# Patient Record
Sex: Female | Born: 1975 | Race: White | Hispanic: No | Smoking: Never smoker
Health system: Southern US, Community
[De-identification: ages and names within clinical notes are randomized; demographics above are authoritative.]

## PROBLEM LIST (undated history)

## (undated) DIAGNOSIS — K121 Other forms of stomatitis: Secondary | ICD-10-CM

## (undated) DIAGNOSIS — Z9889 Other specified postprocedural states: Secondary | ICD-10-CM

## (undated) DIAGNOSIS — F5104 Psychophysiologic insomnia: Secondary | ICD-10-CM

## (undated) DIAGNOSIS — G43909 Migraine, unspecified, not intractable, without status migrainosus: Secondary | ICD-10-CM

## (undated) DIAGNOSIS — M62838 Other muscle spasm: Secondary | ICD-10-CM

## (undated) DIAGNOSIS — F419 Anxiety disorder, unspecified: Secondary | ICD-10-CM

## (undated) DIAGNOSIS — T8859XA Other complications of anesthesia, initial encounter: Secondary | ICD-10-CM

## (undated) DIAGNOSIS — K219 Gastro-esophageal reflux disease without esophagitis: Secondary | ICD-10-CM

## (undated) DIAGNOSIS — T884XXA Failed or difficult intubation, initial encounter: Secondary | ICD-10-CM

## (undated) DIAGNOSIS — R569 Unspecified convulsions: Secondary | ICD-10-CM

## (undated) DIAGNOSIS — D649 Anemia, unspecified: Secondary | ICD-10-CM

## (undated) HISTORY — DX: Other specified postprocedural states: Z98.890

## (undated) HISTORY — PX: OTHER SURGICAL HISTORY: SHX169

## (undated) HISTORY — DX: Psychophysiologic insomnia: F51.04

## (undated) HISTORY — DX: Other forms of stomatitis: K12.1

## (undated) HISTORY — DX: Anxiety disorder, unspecified: F41.9

## (undated) HISTORY — PX: ESOPHAGUS SURGERY: SHX626

## (undated) HISTORY — PX: CHOLECYSTECTOMY: SHX55

## (undated) HISTORY — DX: Other muscle spasm: M62.838

---

## 2011-09-07 HISTORY — PX: ESOPHAGOGASTRODUODENOSCOPY: SHX1529

## 2011-09-07 HISTORY — PX: OTHER SURGICAL HISTORY: SHX169

## 2011-09-07 HISTORY — PX: COLONOSCOPY: SHX174

## 2012-09-06 HISTORY — PX: BREAST EXCISIONAL BIOPSY: SUR124

## 2019-04-16 DIAGNOSIS — D696 Thrombocytopenia, unspecified: Secondary | ICD-10-CM | POA: Insufficient documentation

## 2019-04-16 HISTORY — DX: Thrombocytopenia, unspecified: D69.6

## 2019-07-05 ENCOUNTER — Ambulatory Visit
Admission: EM | Admit: 2019-07-05 | Discharge: 2019-07-05 | Disposition: A | Discharge status: Home / Self Care | Payer: Medicaid Other

## 2019-07-05 ENCOUNTER — Other Ambulatory Visit: Payer: Self-pay

## 2019-07-05 NOTE — ED Notes (Signed)
This RN went out to call patient from lobby.  Patient's visitor stood up and patient asked if he could come back.  This RN explained our current policy on no visitors, and patient's visitor states she is unable to be seen alone.  This RN asked why, and he states she has difficulty remembering her discharge instructions.  This RN explained that she or the provider could come out, with her permission, at the end of her visit, to discuss her discharge instructions, and he became aggressive and told the patient to get her three dollars back for her visit and they will find somewhere else to go.  This RN asked patient's visitor to please put a mask on as he is projecting his voice in a closed lobby, and that this RN states she can talk to the provider, and went to talk with provider.  Provider went to lobby with this RN to discuss situation with both, and patient's visitor became aggressive again, and states they want to leave.  Patient and visitor left.  Will d/c.

## 2019-08-25 ENCOUNTER — Other Ambulatory Visit: Payer: Self-pay

## 2019-08-25 ENCOUNTER — Encounter: Payer: Self-pay | Admitting: Emergency Medicine

## 2019-08-25 ENCOUNTER — Ambulatory Visit
Admission: EM | Admit: 2019-08-25 | Discharge: 2019-08-25 | Disposition: A | Discharge status: Home / Self Care | Payer: Medicaid Other | Attending: Family Medicine | Admitting: Family Medicine

## 2019-08-25 DIAGNOSIS — B9689 Other specified bacterial agents as the cause of diseases classified elsewhere: Secondary | ICD-10-CM | POA: Diagnosis present

## 2019-08-25 DIAGNOSIS — N760 Acute vaginitis: Secondary | ICD-10-CM | POA: Insufficient documentation

## 2019-08-25 DIAGNOSIS — R300 Dysuria: Secondary | ICD-10-CM | POA: Diagnosis present

## 2019-08-25 HISTORY — DX: Gastro-esophageal reflux disease without esophagitis: K21.9

## 2019-08-25 LAB — URINALYSIS, COMPLETE (UACMP) WITH MICROSCOPIC
Bilirubin Urine: NEGATIVE
Glucose, UA: NEGATIVE mg/dL
Hgb urine dipstick: NEGATIVE
Ketones, ur: NEGATIVE mg/dL
Nitrite: NEGATIVE
Protein, ur: NEGATIVE mg/dL
Specific Gravity, Urine: 1.025 (ref 1.005–1.030)
pH: 7 (ref 5.0–8.0)

## 2019-08-25 LAB — WET PREP, GENITAL
Sperm: NONE SEEN
Trich, Wet Prep: NONE SEEN
Yeast Wet Prep HPF POC: NONE SEEN

## 2019-08-25 LAB — PREGNANCY, URINE: Preg Test, Ur: NEGATIVE

## 2019-08-25 MED ORDER — METRONIDAZOLE 500 MG PO TABS: 500.0000 mg | ORAL_TABLET | Freq: Two times a day (BID) | ORAL | 0 refills | Status: DC

## 2019-08-25 NOTE — ED Triage Notes (Signed)
Patient in today c/o urinary frequency x "a few weeks" and dysuria x 5 days. Patient has been taking Azo and cranberry.

## 2019-08-25 NOTE — Discharge Instructions (Signed)
Take medication as prescribed. Rest. Drink plenty of fluids.  ° °Follow up with your primary care physician this week as needed. Return to Urgent care for new or worsening concerns.  ° °

## 2019-08-25 NOTE — ED Provider Notes (Signed)
[facility] ____________________________________________  Time seen: Approximately 5:01 PM  I have reviewed the triage vital signs and the nursing notes.   HISTORY  Chief Complaint Dysuria and Urinary Frequency   HPI Emily Norton is a 43 y.o. female presenting for evaluation of urinary frequency and dysuria.  Patient states for a few weeks she has had some urinary frequency in the last 4 days she has had some burning irritation with urination.  States the burning occurs when the urine hits her skin.  Occasional pelvic cramping, denies abdominal pain.  Denies flank pain, atypical back pain, concern of STDs.  Continues eat and drink well.  No fevers.  States that she feels like she has a UTI and request treatment for a urinary infection.  Has been taken over-the-counter Azo and cranberry pills.  Denies other relieving factors.  Patient's last menstrual period was 08/07/2019 (approximate).    Past Medical History:  Diagnosis Date  . GERD (gastroesophageal reflux disease)     There are no problems to display for this patient.   Past Surgical History:  Procedure Laterality Date  . CHOLECYSTECTOMY       No current facility-administered medications for this encounter.  Current Outpatient Medications:  .  Aspirin-Acetaminophen-Caffeine (EXCEDRIN PO), Take by mouth. prn, Disp: , Rfl:  .  omeprazole (PRILOSEC) 20 MG capsule, Take 20 mg by mouth daily., Disp: , Rfl:  .  metroNIDAZOLE (FLAGYL) 500 MG tablet, Take 1 tablet (500 mg total) by mouth 2 (two) times daily., Disp: 14 tablet, Rfl: 0  Allergies Naproxen sodium  Family History  Problem Relation Age of Onset  . Breast cancer Mother   . Migraines Father     Social History Social History   Tobacco Use  . Smoking status: Never Smoker  . Smokeless tobacco: Never Used  Substance Use Topics  . Alcohol use: Yes    Comment: ocassional  . Drug use: Never    Review of Systems Constitutional: No  fever ENT: No sore throat. Cardiovascular: Denies chest pain. Respiratory: Denies shortness of breath. Gastrointestinal: No abdominal pain.  No nausea, no vomiting.  No diarrhea. Genitourinary: Positive for dysuria. Musculoskeletal: Negative for back pain. Skin: Negative for rash.   ____________________________________________   PHYSICAL EXAM:  VITAL SIGNS: ED Triage Vitals  Enc Vitals Group     BP 08/25/19 1550 132/86     Pulse Rate 08/25/19 1550 98     Resp 08/25/19 1550 16     Temp 08/25/19 1550 98.2 F (36.8 C)     Temp Source 08/25/19 1550 Oral     SpO2 08/25/19 1550 100 %     Weight 08/25/19 1551 110 lb (49.9 kg)     Height 08/25/19 1551 4\' 8"  (1.422 m)     Head Circumference --      Peak Flow --      Pain Score 08/25/19 1551 6     Pain Loc --      Pain Edu? --      Excl. in [place]? --     Constitutional: Alert and oriented. Well appearing and in no acute distress. Eyes: Conjunctivae are normal.  ENT      Head: Normocephalic and atraumatic. Cardiovascular: Normal rate, regular rhythm. Grossly normal heart sounds.  Good peripheral circulation. Respiratory: Normal respiratory effort without tachypnea nor retractions. Breath sounds are clear and equal bilaterally. No wheezes, rales, rhonchi. Gastrointestinal: Soft and nontender. No CVA tenderness. Musculoskeletal:Steady gait.  Neurologic:  Normal speech and language. Speech is  normal. No gait instability.  Skin:  Skin is warm, dry and intact. No rash noted. Psychiatric: Mood and affect are normal. Speech and behavior are normal. Patient exhibits appropriate insight and judgment   ___________________________________________   LABS (all labs ordered are listed, but only abnormal results are displayed)  Labs Reviewed  WET PREP, GENITAL - Abnormal; Notable for the following components:      Result Value   Clue Cells Wet Prep HPF POC PRESENT (*)    WBC, Wet Prep HPF POC RARE (*)    All other components within normal  limits  URINALYSIS, COMPLETE (UACMP) WITH MICROSCOPIC - Abnormal; Notable for the following components:   Color, Urine STRAW (*)    APPearance HAZY (*)    Leukocytes,Ua SMALL (*)    Bacteria, UA RARE (*)    All other components within normal limits  URINE CULTURE  PREGNANCY, URINE   ____________________________________________  PROCEDURES Procedures    INITIAL IMPRESSION / ASSESSMENT AND PLAN / ED COURSE  Pertinent labs & imaging results that were available during my care of the patient were reviewed by me and considered in my medical decision making (see chart for details).  Well-appearing patient.  No acute distress.  Urinalysis reviewed, suspect contaminated sample, will culture.  Encouraged wet prep, patient agreed, patient completed self wet prep.  Wet prep positive for clue cells.  Treatment with Flagyl.  Encourage rest, fluids, supportive care.Discussed indication, risks and benefits of medications with patient.   Discussed follow up with Primary care physician this week. Discussed follow up and return parameters including no resolution or any worsening concerns. Patient verbalized understanding and agreed to plan.   ____________________________________________   FINAL CLINICAL IMPRESSION(S) / ED DIAGNOSES  Final diagnoses:  Bacterial vaginitis  Dysuria     ED Discharge Orders         Ordered    metroNIDAZOLE (FLAGYL) 500 MG tablet  2 times daily     08/25/19 1637           Note: This dictation was prepared with Dragon dictation along with smaller phrase technology. Any transcriptional errors that result from this process are unintentional.         Marylene Land, NP 08/25/19 1703

## 2019-08-27 LAB — URINE CULTURE

## 2020-12-31 ENCOUNTER — Other Ambulatory Visit: Payer: Self-pay

## 2020-12-31 ENCOUNTER — Encounter: Payer: Self-pay | Admitting: Obstetrics

## 2020-12-31 ENCOUNTER — Ambulatory Visit (INDEPENDENT_AMBULATORY_CARE_PROVIDER_SITE_OTHER): Payer: Medicaid Other | Admitting: Obstetrics

## 2020-12-31 ENCOUNTER — Other Ambulatory Visit (HOSPITAL_COMMUNITY)
Admission: RE | Admit: 2020-12-31 | Discharge: 2020-12-31 | Disposition: A | Discharge status: Home / Self Care | Payer: Medicaid Other | Source: Ambulatory Visit | Attending: Obstetrics | Admitting: Obstetrics

## 2020-12-31 VITALS — BP 114/72 | HR 61 | Ht <= 58 in | Wt 115.0 lb

## 2020-12-31 DIAGNOSIS — N898 Other specified noninflammatory disorders of vagina: Secondary | ICD-10-CM | POA: Diagnosis not present

## 2020-12-31 DIAGNOSIS — Z1239 Encounter for other screening for malignant neoplasm of breast: Secondary | ICD-10-CM

## 2020-12-31 DIAGNOSIS — Z01419 Encounter for gynecological examination (general) (routine) without abnormal findings: Secondary | ICD-10-CM

## 2020-12-31 DIAGNOSIS — Z1211 Encounter for screening for malignant neoplasm of colon: Secondary | ICD-10-CM | POA: Diagnosis not present

## 2020-12-31 DIAGNOSIS — Z0000 Encounter for general adult medical examination without abnormal findings: Secondary | ICD-10-CM

## 2020-12-31 NOTE — Progress Notes (Signed)
Pt states she had a "nodule" on right side labia.   Pt will need mammo ordered, pt does have family hx of breast CA. Pt states she feels her breast are "shrinking".  Pt states she has some redness under breast.

## 2020-12-31 NOTE — Progress Notes (Signed)
Subjective:        Emily Norton is a 45 y.o. female here for a routine exam.  Current complaints: Nodule on labia and rash in breast fold on left side.  Also c/o vaginal discharge.  Personal health questionnaire:  Is patient Ashkenazi Jewish, have a family history of breast and/or ovarian cancer: yes Is there a family history of uterine cancer diagnosed at age < 61, gastrointestinal cancer, urinary tract cancer, family member who is a Field seismologist syndrome-associated carrier: no Is the patient overweight and hypertensive, family history of diabetes, personal history of gestational diabetes, preeclampsia or PCOS: no Is patient over 1, have PCOS,  family history of premature CHD under age 12, diabetes, smoke, have hypertension or peripheral artery disease:  no At any time, has a partner hit, kicked or otherwise hurt or frightened you?: no Over the past 2 weeks, have you felt down, depressed or hopeless?: no Over the past 2 weeks, have you felt little interest or pleasure in doing things?:no   Gynecologic History Patient's last menstrual period was 12/10/2020. Contraception: none Last Pap: unknown. Results were: abnormal Last mammogram: none. Results were: none  Obstetric History OB History  No obstetric history on file.    Past Medical History:  Diagnosis Date  . GERD (gastroesophageal reflux disease)     Past Surgical History:  Procedure Laterality Date  . CHOLECYSTECTOMY    . open heart surgery     thoracic surgery per pt     Current Outpatient Medications:  .  Aspirin-Acetaminophen-Caffeine (EXCEDRIN PO), Take by mouth. prn, Disp: , Rfl:  .  metroNIDAZOLE (FLAGYL) 500 MG tablet, Take 1 tablet (500 mg total) by mouth 2 (two) times daily. (Patient not taking: Reported on 12/31/2020), Disp: 14 tablet, Rfl: 0 .  omeprazole (PRILOSEC) 20 MG capsule, Take 20 mg by mouth daily. (Patient not taking: Reported on 12/31/2020), Disp: , Rfl:  Allergies  Allergen Reactions  . Naproxen  Sodium Anaphylaxis    Social History   Tobacco Use  . Smoking status: Never Smoker  . Smokeless tobacco: Never Used  Substance Use Topics  . Alcohol use: Yes    Comment: ocassional    Family History  Problem Relation Age of Onset  . Breast cancer Mother   . Migraines Father       Review of Systems  Constitutional: negative for fatigue and weight loss Respiratory: negative for cough and wheezing Cardiovascular: negative for chest pain, fatigue and palpitations Gastrointestinal: negative for abdominal pain and change in bowel habits Musculoskeletal:negative for myalgias Neurological: negative for gait problems and tremors Behavioral/Psych: negative for abusive relationship, depression Endocrine: negative for temperature intolerance    Genitourinary: positive for vaginal discharge. negative for abnormal menstrual periods, genital lesions, hot flashes, sexual problems  Integument/breast: negative for breast lump, breast tenderness, nipple discharge and skin lesion(s)    Objective:       BP 114/72   Pulse 61   Ht 4\' 8"  (1.422 m)   Wt 115 lb (52.2 kg)   LMP 12/10/2020   BMI 25.78 kg/m  General:   alert and no distress  Skin:   no rash or abnormalities  Lungs:   clear to auscultation bilaterally  Heart:   regular rate and rhythm, S1, S2 normal, no murmur, click, rub or gallop  Breasts:   normal without suspicious masses, skin or nipple changes or axillary nodes  Abdomen:  normal findings: no organomegaly, soft, non-tender and no hernia  Pelvis:  External genitalia: normal general  appearance Urinary system: urethral meatus normal and bladder without fullness, nontender Vaginal: normal without tenderness, induration or masses Cervix: normal appearance Adnexa: normal bimanual exam Uterus: anteverted and non-tender, normal size   Lab Review Urine pregnancy test Labs reviewed yes Radiologic studies reviewed no  I have spent a total of 25 minutes of face-to-face time,  excluding clinical staff time, reviewing notes and preparing to see patient, ordering tests and/or medications, and counseling the patient.  Assessment: and Plan     1. Encounter for routine gynecological examination with Papanicolaou smear of cervix Rx: - Cytology - PAP( [place])  2. Vaginal discharge Rx: - Cervicovaginal ancillary only  3. Screening breast examination Rx: - MM 3D SCREEN BREAST BILATERAL; Future  4. Screening for colon cancer Rx: - Ambulatory referral to Gastroenterology  5. Routine adult health maintenance Rx: - Ambulatory referral to Internal Medicine   Plan:    Education reviewed: calcium supplements, depression evaluation, low fat, low cholesterol diet, safe sex/STD prevention, self breast exams, skin cancer screening and weight bearing exercise. Mammogram ordered. Follow up in: 1 year. Colonoscopy ordered    Orders Placed This Encounter  Procedures  . MM 3D SCREEN BREAST BILATERAL    MEDICAID BASELINE/ NONE/ NO PROBLEMS/NO NEEDS NO BREAST SXS/ TOMO LC W/ ANITRA    Standing Status:   Future    Standing Expiration Date:   12/31/2021    Order Specific Question:   Reason for Exam (SYMPTOM  OR DIAGNOSIS REQUIRED)    Answer:   Screening    Order Specific Question:   Is the patient pregnant?    Answer:   No    Order Specific Question:   Preferred imaging location?    Answer:   [facility]  . Ambulatory referral to Gastroenterology    Referral Priority:   Routine    Referral Type:   Consultation    Referral Reason:   Specialty Services Required    Number of Visits Requested:   1  . Ambulatory referral to Internal Medicine    Referral Priority:   Routine    Referral Type:   Consultation    Referral Reason:   Specialty Services Required    Requested Specialty:   Internal Medicine    Number of Visits Requested:   1   Need to obtain previous records  Shelly Bombard, MD 12/31/2020 11:57 AM

## 2021-01-01 ENCOUNTER — Other Ambulatory Visit: Payer: Self-pay | Admitting: Obstetrics

## 2021-01-01 DIAGNOSIS — B9689 Other specified bacterial agents as the cause of diseases classified elsewhere: Secondary | ICD-10-CM

## 2021-01-01 DIAGNOSIS — N760 Acute vaginitis: Secondary | ICD-10-CM

## 2021-01-01 LAB — CERVICOVAGINAL ANCILLARY ONLY
Bacterial Vaginitis (gardnerella): POSITIVE — AB
Candida Glabrata: NEGATIVE
Candida Vaginitis: NEGATIVE
Chlamydia: NEGATIVE
Comment: NEGATIVE
Comment: NEGATIVE
Comment: NEGATIVE
Comment: NEGATIVE
Comment: NEGATIVE
Comment: NORMAL
Neisseria Gonorrhea: NEGATIVE
Trichomonas: NEGATIVE

## 2021-01-01 MED ORDER — METRONIDAZOLE 500 MG PO TABS: 500.0000 mg | ORAL_TABLET | Freq: Two times a day (BID) | ORAL | 0 refills | Status: DC

## 2021-01-05 LAB — CYTOLOGY - PAP
Comment: NEGATIVE
Diagnosis: NEGATIVE
High risk HPV: NEGATIVE

## 2021-01-06 ENCOUNTER — Ambulatory Visit: Payer: Medicaid Other | Admitting: Podiatry

## 2021-01-16 ENCOUNTER — Ambulatory Visit: Payer: Medicaid Other | Admitting: Podiatry

## 2021-01-19 ENCOUNTER — Other Ambulatory Visit: Payer: Self-pay

## 2021-01-19 ENCOUNTER — Ambulatory Visit (INDEPENDENT_AMBULATORY_CARE_PROVIDER_SITE_OTHER): Payer: Medicaid Other | Admitting: Podiatry

## 2021-01-19 ENCOUNTER — Ambulatory Visit (INDEPENDENT_AMBULATORY_CARE_PROVIDER_SITE_OTHER): Payer: Medicaid Other

## 2021-01-19 DIAGNOSIS — M2041 Other hammer toe(s) (acquired), right foot: Secondary | ICD-10-CM

## 2021-01-19 DIAGNOSIS — M216X9 Other acquired deformities of unspecified foot: Secondary | ICD-10-CM

## 2021-01-19 NOTE — Progress Notes (Signed)
   HPI: 45 y.o. female presenting today for evaluation of symptomatic hammertoes to the right foot that has been present ever since she can remember as a child.  They are extremely painful and currently she is working at [employer] on her feet all day.  She has dealt with these for several years now despite conservative treatment modalities including shoe gear modifications they continue to be painful and symptomatic.  Past Medical History:  Diagnosis Date  . GERD (gastroesophageal reflux disease)       Objective: Physical Exam General: The patient is alert and oriented x3 in no acute distress.  Dermatology: Skin is cool, dry and supple bilateral lower extremities. Negative for open lesions or macerations.  Vascular: Palpable pedal pulses bilaterally. No edema or erythema noted. Capillary refill within normal limits.  Neurological: Epicritic and protective threshold grossly intact bilaterally.   Musculoskeletal Exam: All pedal and ankle joints range of motion within normal limits bilateral. Muscle strength 5/5 in all groups bilateral. Hammertoe contracture deformity noted to digits 3, 4, 5 of the right foot.  Radiographic Exam: Hammertoe contracture deformity noted to the interphalangeal joints and MPJ of the respective hammertoe digits mentioned on clinical musculoskeletal exam.     Assessment: 1.  Hammertoes 3, 4, 5 right foot   Plan of Care:  1. Patient evaluated. X-Rays reviewed.  2. Today we discussed the conservative versus surgical management of the presenting pathology. The patient opts for surgical management. All possible complications and details of the procedure were explained. All patient questions were answered. No guarantees were expressed or implied. 3. Authorization for surgery was initiated today. Surgery will consist of hammertoe arthroplasty/arthrodesis 3, 4, 5 right foot with percutaneous pin fixation 4.  Return to clinic 1 week postop  *Works at  [employer], DPM [facility]  Dr. Edrick Kins, DPM    [address], [place] [address]                Office [phone]  Fax [phone]

## 2021-01-28 NOTE — Progress Notes (Signed)
   CC: Anxiety, depression, insomnia, iron deficiency, migraines, hypothyroidism  HPI:  Emily Norton is a 45 y.o. female with history as below presenting as a new patient for the above issues. Please refer to problem based charting for further details of assessment and plan of current problem and chronic medical conditions.  Past Medical History:  Diagnosis Date  . GERD (gastroesophageal reflux disease)     Current Outpatient Medications  Medication Instructions  . amitriptyline (ELAVIL) 10 mg, Oral, Daily at bedtime  . traZODone (DESYREL) 100 mg, Oral, Daily at bedtime    Past Surgical History:  Procedure Laterality Date  . CHOLECYSTECTOMY    . ESOPHAGUS SURGERY    . open heart surgery     thoracic surgery per pt    Family History  Problem Relation Age of Onset  . Breast cancer Mother   . Migraines Father   . Heart attack Maternal Grandfather     Social History   Tobacco Use  . Smoking status: Never Smoker  . Smokeless tobacco: Never Used  Vaping Use  . Vaping Use: Never used  Substance Use Topics  . Alcohol use: Yes    Comment: ocassional  . Drug use: Not Currently    Review of Systems:   Review of Systems  Constitutional: Positive for malaise/fatigue and weight loss.  Respiratory: Negative for shortness of breath.   Cardiovascular: Negative for chest pain and palpitations.  Gastrointestinal: Positive for nausea. Negative for vomiting.  Neurological: Positive for headaches. Negative for seizures and loss of consciousness.  Psychiatric/Behavioral: Positive for depression and memory loss. Negative for hallucinations, substance abuse and suicidal ideas. The patient is nervous/anxious. The patient does not have insomnia.   All other systems reviewed and are negative.    Physical Exam: Vitals:   01/29/21 0904  BP: 125/80  Pulse: 74  Temp: 98.4 F (36.9 C)  TempSrc: Oral  SpO2: 99%  Weight: 109 lb (49.4 kg)  Height: 4\' 8"  (1.422 m)    Constitutional: no acute distress Head: atraumatic ENT: external ears normal Cardiovascular: regular rate and rhythm, normal heart sounds Pulmonary: effort normal, normal breath sounds bilaterally Abdominal: flat Musculoskeletal: Swelling of bilateral lower extremities, nonpitting Skin: warm and dry Neurological: alert, no focal deficit Psychiatric: normal mood and affect  Assessment & Plan:   See Encounters Tab for problem based charting.  Patient discussed with Dr. Philipp Ovens

## 2021-01-29 ENCOUNTER — Other Ambulatory Visit: Payer: Self-pay

## 2021-01-29 ENCOUNTER — Encounter: Payer: Self-pay | Admitting: Student

## 2021-01-29 ENCOUNTER — Ambulatory Visit (INDEPENDENT_AMBULATORY_CARE_PROVIDER_SITE_OTHER): Payer: Medicaid Other | Admitting: Student

## 2021-01-29 VITALS — BP 125/80 | HR 74 | Temp 98.4°F | Ht <= 58 in | Wt 109.0 lb

## 2021-01-29 DIAGNOSIS — K219 Gastro-esophageal reflux disease without esophagitis: Secondary | ICD-10-CM | POA: Diagnosis not present

## 2021-01-29 DIAGNOSIS — F1090 Alcohol use, unspecified, uncomplicated: Secondary | ICD-10-CM

## 2021-01-29 DIAGNOSIS — Z789 Other specified health status: Secondary | ICD-10-CM

## 2021-01-29 DIAGNOSIS — Z7289 Other problems related to lifestyle: Secondary | ICD-10-CM | POA: Diagnosis not present

## 2021-01-29 DIAGNOSIS — F411 Generalized anxiety disorder: Secondary | ICD-10-CM | POA: Diagnosis not present

## 2021-01-29 DIAGNOSIS — G43409 Hemiplegic migraine, not intractable, without status migrainosus: Secondary | ICD-10-CM

## 2021-01-29 DIAGNOSIS — F329 Major depressive disorder, single episode, unspecified: Secondary | ICD-10-CM | POA: Insufficient documentation

## 2021-01-29 DIAGNOSIS — E876 Hypokalemia: Secondary | ICD-10-CM | POA: Diagnosis not present

## 2021-01-29 DIAGNOSIS — Z0000 Encounter for general adult medical examination without abnormal findings: Secondary | ICD-10-CM

## 2021-01-29 DIAGNOSIS — D509 Iron deficiency anemia, unspecified: Secondary | ICD-10-CM

## 2021-01-29 DIAGNOSIS — R600 Localized edema: Secondary | ICD-10-CM

## 2021-01-29 DIAGNOSIS — D696 Thrombocytopenia, unspecified: Secondary | ICD-10-CM

## 2021-01-29 DIAGNOSIS — F321 Major depressive disorder, single episode, moderate: Secondary | ICD-10-CM | POA: Diagnosis not present

## 2021-01-29 DIAGNOSIS — E785 Hyperlipidemia, unspecified: Secondary | ICD-10-CM | POA: Diagnosis not present

## 2021-01-29 DIAGNOSIS — E039 Hypothyroidism, unspecified: Secondary | ICD-10-CM

## 2021-01-29 DIAGNOSIS — G43909 Migraine, unspecified, not intractable, without status migrainosus: Secondary | ICD-10-CM | POA: Insufficient documentation

## 2021-01-29 HISTORY — DX: Hypothyroidism, unspecified: E03.9

## 2021-01-29 LAB — POCT GLYCOSYLATED HEMOGLOBIN (HGB A1C): Hemoglobin A1C: 5.2 % (ref 4.0–5.6)

## 2021-01-29 LAB — GLUCOSE, CAPILLARY: Glucose-Capillary: 88 mg/dL (ref 70–99)

## 2021-01-29 MED ORDER — AMITRIPTYLINE HCL 10 MG PO TABS: 10.0000 mg | ORAL_TABLET | Freq: Every day | ORAL | 3 refills | Status: DC

## 2021-01-29 MED ORDER — TRAZODONE HCL 100 MG PO TABS: 100.0000 mg | ORAL_TABLET | Freq: Every day | ORAL | 3 refills | Status: DC

## 2021-01-29 NOTE — Assessment & Plan Note (Addendum)
Was seen for this by hematology in August 2020 by which time she actually had resolution of thrombocytopenia.  No interventions by them at that time. - CBC  Addendum: CBC was all within normal limits.  Potassium on the lower limit of normal at 155,000.  No further action at this time, repeat CBC in about 6 months to a year

## 2021-01-29 NOTE — Assessment & Plan Note (Signed)
Patient reports almost daily headaches which are left-sided, throbbing, associated with photophobia and nausea.  These seem fairly mild and resolved with rest.  She has had similar migraines for many years.  This has been treated with Imitrex in the past which has been helpful.  - Start amitriptyline for comorbid depression and insomnia - Follow-up in 1 month

## 2021-01-29 NOTE — Assessment & Plan Note (Signed)
Reports history of GERD and reportedly an ulcer somewhere in her GI tract on pill cam endoscopy in 2015, does not know if she has been tested for H pylori. Not currently on any medication for GERD. Symptoms have been absent over the past few weeks.  - Defer H. pylori testing as there are no symptoms right now - Currently not taking any medications - Counseled on trying to not eat close to bedtime

## 2021-01-29 NOTE — Patient Instructions (Signed)
Thank you for allowing [place] to be a part of your care today, it was a pleasure seeing you. We discussed your anxiety, depression, iron deficiency, acid reflux, hypothyroidism, insomnia  For your anxiety, depression, insomnia, and migraines START Amitriptyline 10mg . Take this at bedtime. Stay at this dose for 1 month then we can increase if not effective enough. This will hopefully also decrease your migraine frequency START trazadone. This will hopefully help with both insomnia and depression Also make those other behavioral changes in "sleep hygiene" that we discussed I am also referring you to our psychologist. They will contact you to make an appointment  I am checking your blood count and iron labs for your iron deficiency I am checking your thyroid function I am also checking some routine screening labs  I will call with the results of your testing, likely tomorrow   Please follow up in 1 month   Thank you, and please call the Internal Medicine Clinic at [phone] if you have any questions.  Best, Dr. Bridgett Larsson

## 2021-01-29 NOTE — Assessment & Plan Note (Addendum)
Reports history of hypothyroidism and was previously on Synthroid, which she stopped using several years ago.  Has had low energy, lower extremity swelling noted.  - Check TSH and free T4  Addendum: Normal TSH and free T4.  Does not require supplementation at this time.

## 2021-01-29 NOTE — Assessment & Plan Note (Signed)
-   Counseled on COVID-vaccine, they declined. - HIV screen ordered - Hepatitis C screen ordered - We will obtain medical records to see if further vaccines are indicated - Cholesterol panel ordered - A1c is normal

## 2021-01-29 NOTE — Progress Notes (Signed)
Internal Medicine Clinic Attending  Case discussed with Dr. Chen  At the time of the visit.  We reviewed the resident's history and exam and pertinent patient test results.  I agree with the assessment, diagnosis, and plan of care documented in the resident's note. 

## 2021-01-29 NOTE — Assessment & Plan Note (Addendum)
Patient reports longstanding history of generalized anxiety, most recently is anxious about her finances.  Also notes a difficult family life in the past.  - Start amitriptyline, see problem about major stressor disorder for more details - Referral to integrated behavioral health for therapy

## 2021-01-29 NOTE — Assessment & Plan Note (Addendum)
Last labs in 2020 at outside facility was demonstrated hemoglobin of 11.2, MCV 93, ferritin of 6.6.  She did not start ferrous sulfate at that time, and was trying to increase dietary intake of iron.  She does endorse some fatigue, no known blood loss besides with her monthly periods which are not particularly heavy.  She reportedly had a colonoscopy around 2015 for anemia in [place] which was negative, but did have PillCam endoscopy which showed an ulcer somewhere along the GI tract.  She is unsure where.  States this went away, has not had any further treatment for it.  - Follow-up CBC, iron, ferritin  Addendum: Anemia, iron, and ferritin within normal limits

## 2021-01-29 NOTE — Assessment & Plan Note (Addendum)
Reports having depressive symptoms for many years.  Has associated anhedonia, guilt, low energy, decreased concentration, decreased appetite, psychomotor agitation, and decreased sleep.  Sometimes feels that she does not want to be here, but there is no active suicidal ideation.  Also denies HI or hallucinations.  She is not sure medication she has tried before.  After discussing a few medication options with her, we have decided on amitriptyline and trazodone for comorbid insomnia and migraines.  - Start amitriptyline 10 mg daily - Start trazodone 100 mg at night - Referral to integrated behavioral health for therapy - Follow-up in 1 month, titrate medications if not therapeutic

## 2021-01-30 ENCOUNTER — Telehealth: Payer: Self-pay | Admitting: Internal Medicine

## 2021-01-30 LAB — CMP14 + ANION GAP
ALT: 12 IU/L (ref 0–32)
AST: 19 IU/L (ref 0–40)
Albumin/Globulin Ratio: 1.5 (ref 1.2–2.2)
Albumin: 4.1 g/dL (ref 3.8–4.8)
Alkaline Phosphatase: 54 IU/L (ref 44–121)
Anion Gap: 18 mmol/L (ref 10.0–18.0)
BUN/Creatinine Ratio: 14 (ref 9–23)
BUN: 10 mg/dL (ref 6–24)
Bilirubin Total: 0.6 mg/dL (ref 0.0–1.2)
CO2: 24 mmol/L (ref 20–29)
Calcium: 8.9 mg/dL (ref 8.7–10.2)
Chloride: 101 mmol/L (ref 96–106)
Creatinine, Ser: 0.69 mg/dL (ref 0.57–1.00)
Globulin, Total: 2.8 g/dL (ref 1.5–4.5)
Glucose: 85 mg/dL (ref 65–99)
Potassium: 3.1 mmol/L — ABNORMAL LOW (ref 3.5–5.2)
Sodium: 143 mmol/L (ref 134–144)
Total Protein: 6.9 g/dL (ref 6.0–8.5)
eGFR: 108 mL/min/{1.73_m2} (ref >59)

## 2021-01-30 LAB — LIPID PANEL
Chol/HDL Ratio: 3.1 ratio (ref 0.0–4.4)
Cholesterol, Total: 190 mg/dL (ref 100–199)
HDL: 62 mg/dL (ref >39)
LDL Chol Calc (NIH): 117 mg/dL — ABNORMAL HIGH (ref 0–99)
Triglycerides: 59 mg/dL (ref 0–149)
VLDL Cholesterol Cal: 11 mg/dL (ref 5–40)

## 2021-01-30 LAB — CBC WITH DIFFERENTIAL/PLATELET
Basophils Absolute: 0.1 10*3/uL (ref 0.0–0.2)
Basos: 1 %
EOS (ABSOLUTE): 0.2 10*3/uL (ref 0.0–0.4)
Eos: 3 %
Hematocrit: 35.8 % (ref 34.0–46.6)
Hemoglobin: 12.2 g/dL (ref 11.1–15.9)
Immature Grans (Abs): 0 10*3/uL (ref 0.0–0.1)
Immature Granulocytes: 0 %
Lymphocytes Absolute: 0.8 10*3/uL (ref 0.7–3.1)
Lymphs: 12 %
MCH: 31 pg (ref 26.6–33.0)
MCHC: 34.1 g/dL (ref 31.5–35.7)
MCV: 91 fL (ref 79–97)
Monocytes Absolute: 0.5 10*3/uL (ref 0.1–0.9)
Monocytes: 7 %
Neutrophils Absolute: 5.2 10*3/uL (ref 1.4–7.0)
Neutrophils: 77 %
Platelets: 155 10*3/uL (ref 150–450)
RBC: 3.93 x10E6/uL (ref 3.77–5.28)
RDW: 15 % (ref 11.7–15.4)
WBC: 6.8 10*3/uL (ref 3.4–10.8)

## 2021-01-30 LAB — IRON: Iron: 68 ug/dL (ref 27–159)

## 2021-01-30 LAB — HEPATITIS C ANTIBODY: Hep C Virus Ab: <0.1 s/co ratio (ref 0.0–0.9)

## 2021-01-30 LAB — HIV ANTIBODY (ROUTINE TESTING W REFLEX): HIV Screen 4th Generation wRfx: NONREACTIVE

## 2021-01-30 LAB — TSH: TSH: 4.32 u[IU]/mL (ref 0.450–4.500)

## 2021-01-30 LAB — T4, FREE: Free T4: 1.32 ng/dL (ref 0.82–1.77)

## 2021-01-30 LAB — FERRITIN: Ferritin: 42 ng/mL (ref 15–150)

## 2021-01-30 NOTE — Telephone Encounter (Signed)
Pt calling regarding results [phone]

## 2021-02-03 ENCOUNTER — Encounter: Payer: Self-pay | Admitting: Student

## 2021-02-03 DIAGNOSIS — E876 Hypokalemia: Secondary | ICD-10-CM

## 2021-02-03 HISTORY — DX: Hypokalemia: E87.6

## 2021-02-03 MED ORDER — POTASSIUM CHLORIDE CRYS ER 20 MEQ PO TBCR: 40.0000 meq | EXTENDED_RELEASE_TABLET | Freq: Once | ORAL | 0 refills | Status: DC

## 2021-02-03 NOTE — Assessment & Plan Note (Signed)
Potassium of 3.1 on recent BMP.  Not taking any medications that are likely to cause this.  Not hypertensive.  - Replete with Klor-Con 40 mEq - Discussed dietary sources of potassium - Follow-up BMP in 1 week

## 2021-02-03 NOTE — Addendum Note (Signed)
Addended by: Andrew Au on: 02/03/2021 08:44 AM   Modules accepted: Orders

## 2021-02-05 ENCOUNTER — Encounter: Payer: Self-pay | Admitting: Internal Medicine

## 2021-02-19 ENCOUNTER — Ambulatory Visit: Payer: Medicaid Other

## 2021-02-19 ENCOUNTER — Other Ambulatory Visit: Payer: Self-pay

## 2021-02-19 ENCOUNTER — Ambulatory Visit
Admission: RE | Admit: 2021-02-19 | Discharge: 2021-02-19 | Disposition: A | Discharge status: Home / Self Care | Payer: Medicaid Other | Source: Ambulatory Visit | Attending: Obstetrics | Admitting: Obstetrics

## 2021-02-19 ENCOUNTER — Ambulatory Visit: Payer: Medicaid Other | Admitting: Behavioral Health

## 2021-02-19 ENCOUNTER — Institutional Professional Consult (permissible substitution): Payer: Medicaid Other | Admitting: Behavioral Health

## 2021-02-19 DIAGNOSIS — Z1239 Encounter for other screening for malignant neoplasm of breast: Secondary | ICD-10-CM

## 2021-02-19 DIAGNOSIS — F331 Major depressive disorder, recurrent, moderate: Secondary | ICD-10-CM

## 2021-02-19 DIAGNOSIS — F439 Reaction to severe stress, unspecified: Secondary | ICD-10-CM

## 2021-02-19 DIAGNOSIS — F411 Generalized anxiety disorder: Secondary | ICD-10-CM
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
## 2021-02-20 ENCOUNTER — Ambulatory Visit: Payer: Medicaid Other

## 2021-02-23 ENCOUNTER — Other Ambulatory Visit: Payer: Self-pay | Admitting: Obstetrics

## 2021-02-23 DIAGNOSIS — R928 Other abnormal and inconclusive findings on diagnostic imaging of breast: Secondary | ICD-10-CM

## 2021-02-26 ENCOUNTER — Encounter: Payer: Self-pay | Admitting: Podiatry

## 2021-03-02 ENCOUNTER — Encounter: Payer: Medicaid Other | Admitting: Internal Medicine

## 2021-03-04 ENCOUNTER — Encounter: Payer: Medicaid Other | Admitting: Podiatry

## 2021-03-04 ENCOUNTER — Ambulatory Visit: Payer: Medicaid Other | Admitting: Internal Medicine

## 2021-03-04 VITALS — BP 125/68 | HR 67 | Temp 97.5°F | Wt 116.2 lb

## 2021-03-04 DIAGNOSIS — G43019 Migraine without aura, intractable, without status migrainosus: Secondary | ICD-10-CM

## 2021-03-04 DIAGNOSIS — Z1239 Encounter for other screening for malignant neoplasm of breast: Secondary | ICD-10-CM

## 2021-03-04 DIAGNOSIS — Z0000 Encounter for general adult medical examination without abnormal findings: Secondary | ICD-10-CM | POA: Diagnosis not present

## 2021-03-04 DIAGNOSIS — E876 Hypokalemia: Secondary | ICD-10-CM | POA: Diagnosis not present

## 2021-03-04 DIAGNOSIS — M549 Dorsalgia, unspecified: Secondary | ICD-10-CM | POA: Diagnosis not present

## 2021-03-04 DIAGNOSIS — F321 Major depressive disorder, single episode, moderate: Secondary | ICD-10-CM | POA: Diagnosis not present

## 2021-03-04 DIAGNOSIS — G930 Cerebral cysts: Secondary | ICD-10-CM | POA: Diagnosis not present

## 2021-03-04 DIAGNOSIS — G8929 Other chronic pain: Secondary | ICD-10-CM

## 2021-03-04 DIAGNOSIS — R55 Syncope and collapse: Secondary | ICD-10-CM

## 2021-03-04 DIAGNOSIS — F411 Generalized anxiety disorder: Secondary | ICD-10-CM | POA: Diagnosis not present

## 2021-03-04 DIAGNOSIS — R1310 Dysphagia, unspecified: Secondary | ICD-10-CM | POA: Diagnosis not present

## 2021-03-04 DIAGNOSIS — G4700 Insomnia, unspecified: Secondary | ICD-10-CM | POA: Insufficient documentation

## 2021-03-04 MED ORDER — DULOXETINE HCL 30 MG PO CPEP: 30.0000 mg | ORAL_CAPSULE | Freq: Every day | ORAL | 2 refills | Status: DC

## 2021-03-04 MED ORDER — TRAZODONE HCL 150 MG PO TABS: 150.0000 mg | ORAL_TABLET | Freq: Every day | ORAL | 0 refills | Status: DC

## 2021-03-04 MED ORDER — RIZATRIPTAN BENZOATE 10 MG PO TABS: 10.0000 mg | ORAL_TABLET | Freq: Once | ORAL | 2 refills | Status: DC | PRN

## 2021-03-04 MED ORDER — RIZATRIPTAN BENZOATE 10 MG PO TABS
10.0000 mg | ORAL_TABLET | Freq: Once | ORAL | 2 refills | Status: DC | PRN
Start: 2021-03-04 — End: 2021-04-10

## 2021-03-04 NOTE — Assessment & Plan Note (Signed)
Patient reports a longstanding history of dysphagia.  She states that when she lived in [place] she was treated with esophageal dilatation many years ago.  She states that she sometimes has difficulty swallowing solid foods.  Denies any difficulty with liquids.  States that she frequently gets nausea and feels that she has a very strong gag reflex.  Plan: Follow-up esophagram

## 2021-03-04 NOTE — Assessment & Plan Note (Addendum)
Patient reports she had a near syncopal episode over the weekend.  She had donated plasma earlier that day.  She states that she likely did not drink enough fluids that day.  If the hours after donating plasma she started feeling poorly overall weak lightheaded and dizzy and fell down.  Boyfriend states that she did not hit her head and did not lose consciousness.  She was back at her baseline within a few seconds.  She has not had any more episodes since.  She states that she is continued to feel lightheaded and dizzy however this has been going on prior to the pre-syncopal episode.  Orthostatics are normal.  Vitals are also normal.  And exam is unremarkable.  Assessment/plan: Consistent with presyncopal episode in the setting of dehydration/low volume status after donating plasma.  Recommended patient increase fluid intake and continue to monitor.

## 2021-03-04 NOTE — Assessment & Plan Note (Signed)
Patient reports chronic back pain.  She states that she has scoliosis and feels that her hump is getting worse.  She also is interested in a breast reduction surgery which could help alleviate some of her back pain.  Discussed how her scoliosis is likely contributing in part to this.  She also mentions that she has short leg syndrome but does not wear shoe lifts or never been treated for this.  Discussed starting duloxetine for her back pain as well as depression and anxiety.  Patient is agreeable to this plan.    Assessment/plan: Chronic back pain is likely multifactorial with a large component due to scoliosis.  -Start duloxetine -Referral to plastic surgery for breast reduction

## 2021-03-04 NOTE — Assessment & Plan Note (Signed)
See above under generalized anxiety

## 2021-03-04 NOTE — Progress Notes (Signed)
   CC: Anxiety, presyncope, insomnia, migraines, dysphagia and back pain  HPI:  [name] is a 45 y.o. with a past medical history listed below presenting for follow-up evaluation of her anxiety/depression as well as to discuss presyncope, insomnia, migraines, dysphagia and back pain. For details of today's visit and the status of his chronic medical issues please refer to the assessment and plan.   Past Medical History:  Diagnosis Date   GERD (gastroesophageal reflux disease)    Hypothyroidism 01/29/2021   Review of Systems:   Review of Systems  Constitutional:  Negative for chills, fever and malaise/fatigue.  Respiratory:  Negative for shortness of breath.   Cardiovascular:  Negative for chest pain and leg swelling.  Gastrointestinal:  Positive for nausea. Negative for abdominal pain, constipation, diarrhea and vomiting.  Musculoskeletal:  Positive for back pain.  Neurological:  Positive for dizziness and headaches. Negative for weakness.  Endo/Heme/Allergies:  Bruises/bleeds easily.  Psychiatric/Behavioral:  The patient is nervous/anxious and has insomnia.     Physical Exam:  Vitals:   03/04/21 0904  BP: 125/68  Pulse: 67  Temp: (!) 97.5 F (36.4 C)  TempSrc: Oral  SpO2: 100%  Weight: 116 lb 3.2 oz (52.7 kg)   Physical Exam General: alert, appears stated age, in no acute distress HEENT: Normocephalic, atraumatic, EOM intact, conjunctiva normal CV: Regular rate and rhythm, no murmurs rubs or gallops Pulm: Clear to auscultation bilaterally, normal work of breathing Abdomen: Soft, nondistended, bowel sounds present, no tenderness to palpation MSK: No lower extremity edema Skin: Warm and dry Neuro: Alert and oriented x3   Assessment & Plan:   See Encounters Tab for problem based charting.  Patient discussed with Dr.  Jimmye Norman

## 2021-03-04 NOTE — Assessment & Plan Note (Signed)
Patient reports that she has a history of a brain cyst that was being intermittently followed up in [place].  States that she may be due for imaging of her head.  Discussed that I am unable to access any previous imaging or notes regarding this.  Patient is to request records from her previous neurologist.

## 2021-03-04 NOTE — Assessment & Plan Note (Signed)
Patient reports continued generalized anxiety with multiple life stressors.  She has been following with Dr. Carolynne Edouard here in our clinic for her generalized anxiety and depression.  She was recently started on amitriptyline for depression and insomnia and pain however states after couple days she stopped taking this medication because it was not helping her.  Discussed that typically it takes a few weeks for to feel any effects of medications like amitriptyline.  Patient is not interested in continuing this medication at this time.  Discussed other medications like duloxetine that could help with her depression anxiety and pain.  Patient is willing to try this medication.  - Start duloxetine 30 mg for 1 week, if well-tolerated increase to 60 mg after 1 week if tolerated -Continue therapy with Dr. Carolynne Edouard -Follow-up in 4 weeks

## 2021-03-04 NOTE — Assessment & Plan Note (Signed)
Patient reports continued difficulty with sleeping.  She states that she falls asleep just fine however wakes up several times during the night.  She is a light sleeper.  She states that she has tried multiple sleep aids including melatonin and most recently trazodone with no help.  She is requesting Ambien.  States that she does not snore.  Does wake up with headaches sometimes.    Assessment/plan: I believe her depression/anxiety and back pain may be contributing to her awakenings and poor quality of sleep.  She is low risk for sleep apnea due to body habitus and no history of hypertension, blood pressure is within normal limits.  Plan to treat her depression anxiety as well as back pain and see if this helps her sleep.  Also recommended increasing trazodone to 150 mg daily at bedtime to see if this helps.

## 2021-03-04 NOTE — Patient Instructions (Addendum)
Ms. Jablon,   It was a pleasure meeting you today. Today we discussed the following:   For your anxiety and back pain I want you to start a medication called duloxetine.  Take 30 mg once daily for a week if tolerated increase this to 2 tablets (60 mg) daily. For your migraines I have sent in a medication called Maxalt 10 mg as needed for migraines.  This is an abortive medication meaning you only need to take this if you feel a migraine coming on.  You can take a second dose after 2 hours if your symptoms have not resolved. For your dysphagia I have sent in an order for an esophagram to further evaluate this. I have sent in a breast mammogram referral as well. You had labs drawn today and we will call you with the results  Plan to follow-up in 1 month.     Please call the internal medicine center clinic if you have any questions or concerns, we may be able to help and keep you from a long and expensive emergency room wait. Our clinic and after hours phone number is [phone], the best time to call is Monday through Friday 9 am to 4 pm but there is always someone available 24/7 if you have an emergency. If you need medication refills please notify your pharmacy one week in advance and they will send [place] a request.   Thank you for allowing [place] to be a part of your care!      Potassium Content of Foods  Potassium is a mineral found in many foods and drinks. It affects how the heartworks, and helps keep fluids and minerals balanced in the body. The amount of potassium you need each day depends on your age and any medical conditions you may have. Talk to your health care provider or dietitian abouthow much potassium you need. The following lists of foods provide the general serving size for foods and the approximate amount of potassium in each serving, listed in milligrams (mg).Actual values may vary depending on the product and how it is processed. High in potassium The following foods and  beverages have 200 mg or more of potassium per serving: Apricots (raw) - 2 have 200 mg of potassium. Apricots (dry) - 5 have 200 mg of potassium. Artichoke - 1 medium has 345 mg of potassium. Avocado -  fruit has 245 mg of potassium. Banana - 1 medium fruit has 425 mg of potassium. [place] or baked beans (canned) -  cup has 280 mg of potassium. White beans (canned) -  cup has 595 mg potassium. Beef roast - 3 oz has 320 mg of potassium. Ground beef - 3 oz has 270 mg of potassium. Beets (raw or cooked) -  cup has 260 mg of potassium. Bran muffin - 2 oz has 300 mg of potassium. Broccoli (cooked) -  cup has 230 mg of potassium. Brussels sprouts -  cup has 250 mg of potassium. Cantaloupe -  cup has 215 mg of potassium. Cereal, 100% bran -  cup has 200-400 mg of potassium. Cheeseburger -1 single fast food burger has 225-400 mg of potassium. Chicken - 3 oz has 220 mg of potassium. Clams (canned) - 3 oz has 535 mg of potassium. Crab - 3 oz has 225 mg of potassium. Dates - 5 have 270 mg of potassium. Dried beans and peas -  cup has 300-475 mg of potassium. Figs (dried) - 2 have 260 mg of potassium. Fish (halibut, tuna, cod,  snapper) - 3 oz has 480 mg of potassium. Fish (salmon, haddock, swordfish, perch) - 3 oz has 300 mg of potassium. Fish (tuna, canned) - 3 oz has 200 mg of potassium. [place] fries (fast food) - 3 oz has 470 mg of potassium. Granola with fruit and nuts -  cup has 200 mg of potassium. Grapefruit juice -  cup has 200 mg of potassium. Honeydew melon -  cup has 200 mg of potassium. Kale (raw) - 1 cup has 300 mg of potassium. Kiwi - 1 medium fruit has 240 mg of potassium. Kohlrabi, rutabaga, parsnips -  cup has 280 mg of potassium. Lentils -  cup has 365 mg of potassium. Mango - 1 each has 325 mg of potassium. Milk (nonfat, low-fat, whole, buttermilk) - 1 cup has 350-380 mg of potassium. Milk (chocolate) - 1 cup has 420 mg of potassium Molasses - 1 Tbsp has 295 mg  of potassium. Mushrooms -  cup has 280 mg of potassium. Nectarine - 1 each has 275 mg of potassium. Nuts (almonds, peanuts, hazelnuts, [place], cashew, mixed) - 1 oz has 200 mg of potassium. Nuts (pistachios) - 1 oz has 295 mg of potassium. Orange - 1 fruit has 240 mg of potassium. Orange juice -  cup has 235 mg of potassium. Papaya -  medium fruit has 390 mg of potassium. Peanut butter (chunky) - 2 Tbsp has 240 mg of potassium. Peanut butter (smooth) - 2 Tbsp has 210 mg of potassium. Pear - 1 medium (200 mg) of potassium. Pomegranate - 1 whole fruit has 400 mg of potassium. Pomegranate juice -  cup has 215 mg of potassium. Pork - 3 oz has 350 mg of potassium. Potato chips (salted) - 1 oz has 465 mg of potassium. Potato (baked with skin) - 1 medium has 925 mg of potassium. Potato (boiled) -  cup has 255 mg of potassium. Potato (Mashed) -  cup has 330 mg of potassium. Prune juice -  cup has 370 mg of potassium. Prunes - 5 have 305 mg of potassium. Pudding (chocolate) -  cup has 230 mg of potassium. Pumpkin (canned) -  cup has 250 mg of potassium. Raisins (seedless) -  cup has 270 mg of potassium. Seeds (sunflower or pumpkin) - 1 oz has 240 mg of potassium. Soy milk - 1 cup has 300 mg of potassium. Spinach (cooked) - 1/2 cup has 420 mg of potassium. Spinach (canned) -  cup has 370 mg of potassium. Sweet potato (baked with skin) - 1 medium has 450 mg of potassium. Swiss chard -  cup has 480 mg of potassium. Tomato or vegetable juice -  cup has 275 mg of potassium. Tomato (sauce or puree) -  cup has 400-550 mg of potassium. Tomato (raw) - 1 medium has 290 mg of potassium. Tomato (canned) -  cup has 200-300 mg of potassium. [place] - 3 oz has 250 mg of potassium. Wheat germ - 1 oz has 250 mg of potassium. Winter squash -  cup has 250 mg of potassium. Yogurt (plain or fruited) - 6 oz has 260-435 mg of potassium. Zucchini -  cup has 220 mg of potassium. Moderate in  potassium The following foods and beverages have 50-200 mg of potassium per serving: Apple - 1 fruit has 150 mg of potassium Apple juice -  cup has 150 mg of potassium Applesauce -  cup has 90 mg of potassium Apricot nectar -  cup has 140 mg of potassium Asparagus (small spears) -  cup has 155 mg of potassium Asparagus (large spears) - 6 have 155 mg of potassium Bagel (cinnamon raisin) - 1 four-inch bagel has 130 mg of potassium Bagel (egg or plain) - 1 four- inch bagel has 70 mg of potassium Beans (green) -  cup has 90 mg of potassium Beans (yellow) -  cup has 190 mg of potassium Beer, regular - 12 oz has 100 mg of potassium Beets (canned) -  cup has 125 mg of potassium Blackberries -  cup has 115 mg of potassium Blueberries -  cup has 60 mg of potassium Bread (whole wheat) - 1 slice has 70 mg of potassium Broccoli (raw) -  cup has 145 mg of potassium Cabbage -  cup has 150 mg of potassium Carrots (cooked or raw) -  cup has 180 mg of potassium Cauliflower (raw) -  cup has 150 mg of potassium Celery (raw) -  cup has 155 mg of potassium Cereal, bran flakes -  cup has 120-150 mg of potassium Cheese (cottage) -  cup has 110 mg of potassium Cherries - 10 have 150 mg of potassium Chocolate - 1 oz bar has 165 mg of potassium Coffee (brewed) - 6 oz has 90 mg of potassium Corn -  cup or 1 ear has 195 mg of potassium Cucumbers -  cup has 80 mg of potassium Egg - 1 large egg has 60 mg of potassium Eggplant -  cup has 60 mg of potassium Endive (raw) -  cup has 80 mg of potassium English muffin - 1 has 65 mg of potassium Fish (ocean perch) - 3 oz has 192 mg of potassium Frankfurter, beef or pork - 1 has 75 mg of potassium Fruit cocktail -  cup has 115 mg of potassium Grape juice -  cup has 170 mg of potassium Grapefruit -  fruit has 175 mg of potassium Grapes -  cup has 155 mg of potassium Greens: kale, turnip, collard -  cup has 110-150 mg of potassium Ice cream  or frozen yogurt (chocolate) -  cup has 175 mg of potassium Ice cream or frozen yogurt (vanilla) -  cup has 120-150 mg of potassium Lemons, limes - 1 each has 80 mg of potassium Lettuce - 1 cup has 100 mg of potassium Mixed vegetables -  cup has 150 mg of potassium Mushrooms, raw -  cup has 110 mg of potassium Nuts (walnuts, pecans, or macadamia) - 1 oz has 125 mg of potassium Oatmeal -  cup has 80 mg of potassium Okra -  cup has 110 mg of potassium Onions -  cup has 120 mg of potassium Peach - 1 has 185 mg of potassium Peaches (canned) -  cup has 120 mg of potassium Pears (canned) -  cup has 120 mg of potassium Peas, green (frozen) -  cup has 90 mg of potassium Peppers (Green) -  cup has 130 mg of potassium Peppers (Red) -  cup has 160 mg of potassium Pineapple juice -  cup has 165 mg of potassium Pineapple (fresh or canned) -  cup has 100 mg of potassium Plums - 1 has 105 mg of potassium Pudding, vanilla -  cup has 150 mg of potassium Raspberries -  cup has 90 mg of potassium Rhubarb -  cup has 115 mg of potassium Rice, wild -  cup has 80 mg of potassium Shrimp - 3 oz has 155 mg of potassium Spinach (raw) - 1 cup has 170 mg of potassium Strawberries -  cup  has 125 mg of potassium Summer squash -  cup has 175-200 mg of potassium Swiss chard (raw) - 1 cup has 135 mg of potassium Tangerines - 1 fruit has 140 mg of potassium Tea, brewed - 6 oz has 65 mg of potassium Turnips -  cup has 140 mg of potassium Watermelon -  cup has 85 mg of potassium Wine (Red, table) - 5 oz has 180 mg of potassium Wine (White, table) - 5 oz 100 mg of potassium Low in potassium The following foods and beverages have less than 50 mg of potassium per serving. Bread (white) - 1 slice has 30 mg of potassium Carbonated beverages - 12 oz has less than 5 mg of potassium Cheese - 1 oz has 20-30 mg of potassium Cranberries -  cup has 45 mg of potassium Cranberry juice cocktail -  cup  has 20 mg of potassium Fats and oils - 1 Tbsp has less than 5 mg of potassium Hummus - 1 Tbsp has 32 mg of potassium Nectar (papaya, mango, or pear) -  cup has 35 mg of potassium Rice (white or brown) -  cup has 50 mg of potassium Spaghetti or macaroni (cooked) -  cup has 30 mg of potassium Tortilla, flour or corn - 1 has 50 mg of potassium Waffle - 1 four-inch waffle has 50 mg of potassium Water chestnuts -  cup has 40 mg of potassium Summary Potassium is a mineral found in many foods and drinks. It affects how the heart works, and helps keep fluids and minerals balanced in the body. The amount of potassium you need each day depends on your age and any existing medical conditions you may have. Your health care provider or dietitian may recommend an amount of potassium that you should have each day. This information is not intended to replace advice given to you by your health care provider. Make sure you discuss any questions you have with your healthcare provider. Document Revised: 08/05/2017 Document Reviewed: 11/17/2016 Elsevier Patient Education  [place].

## 2021-03-04 NOTE — Assessment & Plan Note (Signed)
Potassium 3.1 at last visit.  We will recheck BMP today.

## 2021-03-04 NOTE — Assessment & Plan Note (Signed)
Patient reports continued headaches a few times a week.  She states that they are usually frontal in nature associated with photophobia and nausea.  She typically has to go lay down and rest once she has these headaches.  She states that sometimes NSAIDs do alleviate her symptoms.  Sometimes she is requiring very high doses of ibuprofen with little relief.    Plan:  Due to severity and frequency of her headaches will prescribe an abortive agent  - Start rizatriptan 10 mg as needed

## 2021-03-05 LAB — BMP8+ANION GAP
Anion Gap: 14 mmol/L (ref 10.0–18.0)
BUN/Creatinine Ratio: 20 (ref 9–23)
BUN: 12 mg/dL (ref 6–24)
CO2: 21 mmol/L (ref 20–29)
Calcium: 8.4 mg/dL — ABNORMAL LOW (ref 8.7–10.2)
Chloride: 103 mmol/L (ref 96–106)
Creatinine, Ser: 0.6 mg/dL (ref 0.57–1.00)
Glucose: 85 mg/dL (ref 65–99)
Potassium: 4 mmol/L (ref 3.5–5.2)
Sodium: 138 mmol/L (ref 134–144)
eGFR: 112 mL/min/{1.73_m2} (ref >59)

## 2021-03-10 ENCOUNTER — Telehealth: Payer: Self-pay

## 2021-03-10 NOTE — Progress Notes (Signed)
Internal Medicine Clinic Attending ° °Case discussed with Dr. Rehman  At the time of the visit.  We reviewed the resident’s history and exam and pertinent patient test results.  I agree with the assessment, diagnosis, and plan of care documented in the resident’s note.  ° °

## 2021-03-10 NOTE — Telephone Encounter (Signed)
Called patient and discussed with her and her partner the lab results.  They are both quite concerned about her persistent symptoms of waking up feeling nauseous and having this feeling in her throat.  I discussed with them that it appears as though we are attempting to reach out to her prior GI and neurology offices for records.  Discussed with them that I will follow-up with our front desk staff tomorrow to figure out where we are in this process.  Also discussed that I agree with Dr. Olevia Perches plan in terms of an esophagram.  Inform them that a front desk staff is still working on the referral and that they will receive a call in the future to schedule this imaging.

## 2021-03-10 NOTE — Telephone Encounter (Signed)
Pt is requesting a call back .. she stated that at her last Ov 03/04/21 she had blood work done but has yet to get her results

## 2021-03-11 ENCOUNTER — Encounter: Payer: Medicaid Other | Admitting: Podiatry

## 2021-03-13 ENCOUNTER — Other Ambulatory Visit: Payer: Self-pay | Admitting: Podiatry

## 2021-03-13 ENCOUNTER — Telehealth: Payer: Self-pay | Admitting: Podiatry

## 2021-03-13 ENCOUNTER — Telehealth: Payer: Self-pay | Admitting: *Deleted

## 2021-03-13 DIAGNOSIS — M2041 Other hammer toe(s) (acquired), right foot: Secondary | ICD-10-CM

## 2021-03-13 HISTORY — PX: BREAST BIOPSY: SHX20

## 2021-03-13 NOTE — Progress Notes (Signed)
PRN postop 

## 2021-03-13 NOTE — Telephone Encounter (Signed)
Pt is requesting a walker and cane. She will need it for after sx on 03/19/2021. Patient is requesting someone call them

## 2021-03-13 NOTE — Telephone Encounter (Signed)
Order placed for walker. Insurance will likely not cover both a cane and walker. - Dr. Amalia Hailey

## 2021-03-13 NOTE — Telephone Encounter (Signed)
Patient's boyfriend is calling and said that they might be needing some after surgery medical equipment. Returned call for more information but no answer. Will try call again later.

## 2021-03-16 ENCOUNTER — Ambulatory Visit: Payer: Medicaid Other | Admitting: Behavioral Health

## 2021-03-16 ENCOUNTER — Other Ambulatory Visit: Payer: Self-pay | Admitting: *Deleted

## 2021-03-16 DIAGNOSIS — F4312 Post-traumatic stress disorder, chronic: Secondary | ICD-10-CM

## 2021-03-16 DIAGNOSIS — F331 Major depressive disorder, recurrent, moderate: Secondary | ICD-10-CM

## 2021-03-16 DIAGNOSIS — F419 Anxiety disorder, unspecified: Secondary | ICD-10-CM

## 2021-03-16 NOTE — Telephone Encounter (Signed)
Thx Estill Bamberg!!!

## 2021-03-16 NOTE — Telephone Encounter (Signed)
Patient is calling to request a wheelchair script for a walker for upcoming surgery on Thursday. Please fax to: [employer], attn: Pam  [phone].

## 2021-03-16 NOTE — BH Specialist Note (Signed)
Integrated Behavioral Health via Telemedicine Visit  03/16/2021 Emily Norton [id number]  Number of [place] visits: 2/6 Session Start time: 9:45am Session End time: 10:30am Total time: 45   Referring Provider: Dr. Edison Simon, MD for anx/dep & insomnia Patient/Family location: Pt & BF Lorna Few are tgthr today for session [facility] Provider location: [facility] Office All persons participating in visit: Pt, BF & Clinician Types of Service:  Cpl Therapy using some aspects of EFT & Gottman  I connected with [name] and/or [place] R Eguia's  BF David  via  Telephone or [employer]  (Video is Tree surgeon) and verified that I am speaking with the correct person using two identifiers. Discussed confidentiality:  2nd session  I discussed the limitations of telemedicine and the availability of in person appointments.  Discussed there is a possibility of technology failure and discussed alternative modes of communication if that failure occurs.  I discussed that engaging in this telemedicine visit, they consent to the provision of behavioral healthcare and the services will be billed under their insurance.  Patient and/or legal guardian expressed understanding and consented to Telemedicine visit: Yes   Presenting Concerns: Patient and/or family reports the following symptoms/concerns: Pt has elevated anx/dep due to life stressors that trigger her PTSD multiple times daily. Cpl relationship is also triggering.  Duration of problem: 4 yrs; Severity of problem: moderate  Patient and/or Family's Strengths/Protective Factors: Concrete supports in place (healthy food, safe environments, etc.) and BF is articulate whereas Pt has more difficulty being verbal.  Goals Addressed: Patient will:  Reduce symptoms of: anxiety, mood instability, and stress; assist Cpl to communicate more & give ea other respect, kindness & regard. Reduce  triggering for Pt & frustration for BF.  Increase knowledge and/or ability of: coping skills, healthy habits, self-management skills, stress reduction, and Cpl communication    Demonstrate ability to: Increase healthy adjustment to current life circumstances, Increase adequate support systems for patient/family, and Increase motivation to adhere to plan of care  Progress towards Goals: Ongoing; Pt & BF will engage in 3X5 card activity sent in the mail today. List of 10 Cpl traits they can work on tgthr. Five blank cards to make up your own traits that are important.   Interventions: Interventions utilized:  Behavioral Activation, Supportive Counseling, and TIC Standardized Assessments completed:  screeners prn  Patient and/or Family Response: Pt & Sig Other receptive to call today & request future sessions. Next visit we will discuss the cards. Then, we will break off into sep sessions for the next 2 visits.  Assessment: Patient currently experiencing elevated anx/dep & worry. Pt has been under some stress the past week. She has been hearing voices randomly. They are neg for command/report. Pt is not fearful of voices. She just notices they are there. Voices do not speak in conversation-just say hello. Pt has had this happen 4-5 times over the past 4 yrs while in relationship w/Sig Other.   Patient may benefit from inc'd time to speak w/Clinician alone. We have planned for this. BF tends to dominate the session & gets aggrivated when she interupts him. Unless she interupts she cannot get a word in the conversation. Checked for safety in Cpl relationship when not speaking in session. Pt & BF agreed they can argue, but it does not get Px.  Plan: Follow up with behavioral health clinician on : 2-3 wks for 60 min telehealth Behavioral recommendations: Complete the activity sent in the mail  prior to next session so we can discuss. Referral(s): [place] (In Clinic)  I  discussed the assessment and treatment plan with the patient and/or parent/guardian. They were provided an opportunity to ask questions and all were answered. They agreed with the plan and demonstrated an understanding of the instructions.   They were advised to call back or seek an in-person evaluation if the symptoms worsen or if the condition fails to improve as anticipated.  Donnetta Hutching, LMFT

## 2021-03-16 NOTE — Telephone Encounter (Signed)
Faxed referral to [facility]([phone]).confirmation received-03/16/21

## 2021-03-17 ENCOUNTER — Other Ambulatory Visit: Payer: Self-pay | Admitting: Obstetrics

## 2021-03-17 ENCOUNTER — Ambulatory Visit
Admission: RE | Admit: 2021-03-17 | Discharge: 2021-03-17 | Disposition: A | Discharge status: Home / Self Care | Payer: Medicaid Other | Source: Ambulatory Visit | Attending: Obstetrics | Admitting: Obstetrics

## 2021-03-17 ENCOUNTER — Telehealth: Payer: Self-pay | Admitting: *Deleted

## 2021-03-17 ENCOUNTER — Encounter: Payer: Self-pay | Admitting: *Deleted

## 2021-03-17 ENCOUNTER — Other Ambulatory Visit: Payer: Self-pay

## 2021-03-17 ENCOUNTER — Telehealth: Payer: Self-pay

## 2021-03-17 DIAGNOSIS — R928 Other abnormal and inconclusive findings on diagnostic imaging of breast: Secondary | ICD-10-CM

## 2021-03-17 DIAGNOSIS — R921 Mammographic calcification found on diagnostic imaging of breast: Secondary | ICD-10-CM

## 2021-03-17 NOTE — Telephone Encounter (Signed)
Faxed order for rolling walker with seat to [employer]/fax # [phone]. The order for a wheelchair was incorrect.

## 2021-03-19 ENCOUNTER — Other Ambulatory Visit: Payer: Self-pay | Admitting: Podiatry

## 2021-03-19 ENCOUNTER — Telehealth: Payer: Self-pay | Admitting: Behavioral Health

## 2021-03-19 DIAGNOSIS — M2041 Other hammer toe(s) (acquired), right foot: Secondary | ICD-10-CM | POA: Diagnosis not present

## 2021-03-19 MED ORDER — OXYCODONE-ACETAMINOPHEN 5-325 MG PO TABS: 1.0000 | ORAL_TABLET | ORAL | 0 refills | Status: DC | PRN

## 2021-03-19 NOTE — Telephone Encounter (Signed)
RC to Pt's support friend Lorna Few re: Pt need for extra support as she attempts to RTSch @ [place]. Pt attended in 1994 & her records have since been destroyed. Pt was on the Special Edu Track. Shanon Brow spoke w/ Nada Boozer on [place] to determine if he can promote this extra assistance for Pt.   Directed Shanon Brow to [facility] to [place] & ask them to forward ppw they need to Clinician @ [facility] [phone]) in a timely manner according to [facility]'s deadlines. Shanon Brow acknowledged & agreed.  Dr. Theodis Shove

## 2021-03-19 NOTE — Progress Notes (Signed)
PRN postop 

## 2021-03-22 ENCOUNTER — Encounter: Payer: Self-pay | Admitting: Podiatry

## 2021-03-24 NOTE — Telephone Encounter (Signed)
Arrrghhh! They can just wait until 7/25 if that's easier for them. - Dr. Amalia Hailey.   *But not 8am

## 2021-03-24 NOTE — Telephone Encounter (Signed)
Emily Norton will you please contact this patient and readjust her postop appts to whatever works best for her, if my schedule allows. Thanks! Dr. Amalia Hailey

## 2021-03-25 ENCOUNTER — Encounter: Payer: Medicaid Other | Admitting: Podiatry

## 2021-03-30 ENCOUNTER — Other Ambulatory Visit: Payer: Self-pay

## 2021-03-30 ENCOUNTER — Ambulatory Visit (INDEPENDENT_AMBULATORY_CARE_PROVIDER_SITE_OTHER): Payer: Medicaid Other

## 2021-03-30 ENCOUNTER — Ambulatory Visit (INDEPENDENT_AMBULATORY_CARE_PROVIDER_SITE_OTHER): Payer: Medicaid Other | Admitting: Podiatry

## 2021-03-30 DIAGNOSIS — Z9889 Other specified postprocedural states: Secondary | ICD-10-CM

## 2021-03-30 DIAGNOSIS — M2041 Other hammer toe(s) (acquired), right foot: Secondary | ICD-10-CM

## 2021-03-30 NOTE — Progress Notes (Signed)
   Subjective:  Patient presents today status post hammertoe repair 3, 4, 5 right foot. DOS: 03/19/2021.  Patient states that she is feeling well.  She has been wearing the boot and mostly nonweightbearing.  No new complaints at this time  Past Medical History:  Diagnosis Date   GERD (gastroesophageal reflux disease)    Hypothyroidism 01/29/2021      Objective/Physical Exam Neurovascular status intact.  Skin incisions appear to be well coapted with sutures intact. No sign of infectious process noted. No dehiscence. No active bleeding noted.  Negative for any significant edema to the foot.  Toes are in good rectus alignment  Radiographic Exam:  Orthopedic percutaneous fixation pins appear to be stable with routine healing with good rectus alignment of the digits 3-5 of the foot  Assessment: 1. s/p hammertoe repair 3, 4, 5 RT foot. DOS: 03/19/2021   Plan of Care:  1. Patient was evaluated. X-rays reviewed.  Sutures removed 2.  Dressings changed 3.  Continue weightbearing in the cam boot 4.  Return to clinic in 3 weeks for percutaneous fixation pin removal   Edrick Kins, DPM [facility]  Dr. Edrick Kins, DPM    [address], [place] [address]                Office [phone]  Fax [phone]

## 2021-03-30 NOTE — Addendum Note (Signed)
Addended by: Lind Guest on: 03/30/2021 09:57 AM   Modules accepted: Orders

## 2021-04-01 ENCOUNTER — Encounter: Payer: Medicaid Other | Admitting: Podiatry

## 2021-04-02 ENCOUNTER — Ambulatory Visit
Admission: RE | Admit: 2021-04-02 | Discharge: 2021-04-02 | Disposition: A | Discharge status: Home / Self Care | Payer: Medicaid Other | Source: Ambulatory Visit | Attending: Obstetrics | Admitting: Obstetrics

## 2021-04-02 ENCOUNTER — Other Ambulatory Visit: Payer: Self-pay

## 2021-04-02 DIAGNOSIS — R921 Mammographic calcification found on diagnostic imaging of breast: Secondary | ICD-10-CM

## 2021-04-03 ENCOUNTER — Encounter: Payer: Self-pay | Admitting: Podiatry

## 2021-04-03 ENCOUNTER — Telehealth: Payer: Self-pay | Admitting: *Deleted

## 2021-04-03 NOTE — Telephone Encounter (Signed)
Patient's friend (David)is calling because he has sent a picture of patient's foot and it looks as though the pin is slipping out of the 3 rd toe, the 2nd toe seems displaced by the procedure.Should they be concerned?Please advise.

## 2021-04-07 ENCOUNTER — Telehealth: Payer: Self-pay | Admitting: Podiatry

## 2021-04-07 ENCOUNTER — Ambulatory Visit: Payer: Medicaid Other | Admitting: Behavioral Health

## 2021-04-07 DIAGNOSIS — F411 Generalized anxiety disorder: Secondary | ICD-10-CM

## 2021-04-07 DIAGNOSIS — F331 Major depressive disorder, recurrent, moderate: Secondary | ICD-10-CM

## 2021-04-07 NOTE — Telephone Encounter (Signed)
Patient needs a letter on our letterhead with our address stating she can give plasma.   Please give her a call.

## 2021-04-07 NOTE — Telephone Encounter (Signed)
That is fine.  Dr. Amalia Hailey

## 2021-04-07 NOTE — Telephone Encounter (Signed)
Pt came in stating she is concerned with the pins in her foot. She has sent a Mychart message and called our office on Friday and has not heard back. Please advise.

## 2021-04-07 NOTE — Telephone Encounter (Signed)
Nothing too concerning... just leave it alone. If they are real concerned they can come in for a sooner appt. Maybe on the nurse schedule. - Dr. Amalia Hailey

## 2021-04-07 NOTE — Telephone Encounter (Signed)
I can't find the pic in mychart. But if she is concerned she can come in for a sooner appt... maybe on the nurse schedule. - Dr. Amalia Hailey

## 2021-04-07 NOTE — BH Specialist Note (Signed)
Integrated Behavioral Health via Telemedicine Visit  04/07/2021 Emily Norton [id number]  Number of [place] visits: 3/6 Session Start time: 11:00am  Session End time: 11:45am Total time: 11   Referring Provider: Dr. Edison Simon, MD Patient/Family location: Pt is @ Plasma Donation site & in private location [facility] Provider location: [facility] Office All persons participating in visit: Pt, intermittent ck-in w/her BF Emily Norton & Clinician Types of Service: Individual psychotherapy  I connected with Emily Norton and/or Emily Norton's  self  via  Telephone or [employer]  (Video is Tree surgeon) and verified that I am speaking with the correct person using two identifiers. Discussed confidentiality:  3/6  I discussed the limitations of telemedicine and the availability of in person appointments.  Discussed there is a possibility of technology failure and discussed alternative modes of communication if that failure occurs.  I discussed that engaging in this telemedicine visit, they consent to the provision of behavioral healthcare and the services will be billed under their insurance.  Patient and/or legal guardian expressed understanding and consented to Telemedicine visit: 3rd visit  Presenting Concerns: Patient and/or family reports the following symptoms/concerns: Elevated concerns for ppw due to SSI as a part of process to retain her [employer] (deadline in Oct). Pt is in process of Disability/SSI & Sch & securing a job on the computer. Pt has anxiety attacks when she bcms overwhelmed. She feels she may need medication for her anxiety. Discussed how Disability will want her to schedule a Psychiatric appt for Eval/Assessment. She will need to wait on this process & put Referral on hold to [facility].  Pt had a recent health scare when the Imaging Ctr saw something on her Mammogram. This resulted in a dbl biopsy where Pt health was  cleared. She has a Family Hx of Breast Ca & will now be fllwd every 6 mos Duration of problem: Pt has exp'd intense anxiety for most of her life; Severity of problem: moderate trending to severe w/out further Tx.  Pt's childhood narrative via FOO has promoted lacking of self-confidence & ability. Offered to Pt she is on her right track & her efforts are tremendous for herself. Encouragement to cont in this journey.  Pt c/o low E levels & fatigue @ times.  Patient and/or Family's Strengths/Protective Factors: Concrete supports in place (healthy food, safe environments, etc.), Sense of purpose, and Physical Health (exercise, healthy diet, medication compliance, etc.)  Goals Addressed: Patient will:  Reduce symptoms of: anxiety, depression, and stress   Increase knowledge and/or ability of: coping skills, healthy habits, and stress reduction   Demonstrate ability to: Increase healthy adjustment to current life circumstances and Increase adequate support systems for patient/family  Progress towards Goals: Ongoing and initiation of Cpl Th activity via mailer from Clinician. Pt is not w/her Partner today, so we have put this activity on hold until next session.   Interventions: Interventions utilized:  Motivational Interviewing, Behavioral Activation, and Supportive Counseling Standardized Assessments completed:  screeners prn  Patient and/or Family Response: Pt receptive to call today & inc'gly verbal as telehealth visit proceeded. Pt open to another telehealth session.  Assessment: Patient currently experiencing elevated anxiety when life bcms too overwhelming. She is trying to secure a computer job, manage her Disability Claim & SSI requirements. Pt is also wanting to RTSch @ [place].  Patient may benefit from cont'd support for her efforts. Pt can re-story her narrative to make it her own.  Plan: Follow  up with behavioral health clinician on : 2-3 wks on telehealth for 60 min Behavioral  recommendations: Be patient w/yourself & your Partner-you both want the same thing; contentment & a rewarding life w/reduced struggling. Referral(s): [place] (In Clinic) and Psychiatrist via Disability Eval/Assessment  I discussed the assessment and treatment plan with the patient and/or parent/guardian. They were provided an opportunity to ask questions and all were answered. They agreed with the plan and demonstrated an understanding of the instructions.   They were advised to call back or seek an in-person evaluation if the symptoms worsen or if the condition fails to improve as anticipated.  Donnetta Hutching, LMFT

## 2021-04-08 ENCOUNTER — Encounter: Payer: Self-pay | Admitting: Podiatry

## 2021-04-08 NOTE — Telephone Encounter (Signed)
Returned call to patient, no answer, left vmessage per Dr Rebekah Chesterfield recommendations.

## 2021-04-10 ENCOUNTER — Ambulatory Visit (HOSPITAL_COMMUNITY)
Admission: RE | Admit: 2021-04-10 | Discharge: 2021-04-10 | Disposition: A | Discharge status: Home / Self Care | Payer: Medicaid Other | Source: Ambulatory Visit | Attending: Internal Medicine | Admitting: Internal Medicine

## 2021-04-10 ENCOUNTER — Other Ambulatory Visit: Payer: Self-pay

## 2021-04-10 ENCOUNTER — Ambulatory Visit: Payer: Medicaid Other | Admitting: Internal Medicine

## 2021-04-10 ENCOUNTER — Other Ambulatory Visit: Payer: Self-pay | Admitting: Internal Medicine

## 2021-04-10 ENCOUNTER — Encounter: Payer: Self-pay | Admitting: *Deleted

## 2021-04-10 VITALS — BP 139/85 | HR 70 | Temp 98.0°F | Ht <= 58 in | Wt 118.2 lb

## 2021-04-10 DIAGNOSIS — R1310 Dysphagia, unspecified: Secondary | ICD-10-CM

## 2021-04-10 DIAGNOSIS — G43019 Migraine without aura, intractable, without status migrainosus: Secondary | ICD-10-CM | POA: Diagnosis not present

## 2021-04-10 DIAGNOSIS — F411 Generalized anxiety disorder: Secondary | ICD-10-CM | POA: Diagnosis not present

## 2021-04-10 DIAGNOSIS — G4700 Insomnia, unspecified: Secondary | ICD-10-CM | POA: Diagnosis present

## 2021-04-10 MED ORDER — RIZATRIPTAN BENZOATE 10 MG PO TABS: 10.0000 mg | ORAL_TABLET | Freq: Once | ORAL | 2 refills | Status: DC | PRN

## 2021-04-10 MED ORDER — SUMATRIPTAN SUCCINATE 100 MG PO TABS: 100.0000 mg | ORAL_TABLET | Freq: Once | ORAL | 2 refills | Status: DC | PRN

## 2021-04-10 MED ORDER — DULOXETINE HCL 30 MG PO CPEP: 30.0000 mg | ORAL_CAPSULE | Freq: Every day | ORAL | 2 refills | Status: DC

## 2021-04-10 NOTE — Progress Notes (Signed)
   CC: Insomnia  HPI:  Ms.Krystalynn R Wittkop is a 45 y.o. with a past medical history listed below presenting for follow-up of her insomnia. For details of today's visit and the status of his chronic medical issues please refer to the assessment and plan.   Past Medical History:  Diagnosis Date   GERD (gastroesophageal reflux disease)    Hypothyroidism 01/29/2021   Review of Systems:   Negative except as per assessment and plan  Physical Exam:  Vitals:   04/10/21 1019  BP: 139/85  Pulse: 70  Temp: 98 F (36.7 C)  SpO2: 100%  Weight: 118 lb 3.2 oz (53.6 kg)  Height: '4\' 8"'$  (1.422 m)   Physical Exam General: alert, appears stated age, in no acute distress HEENT: Normocephalic, atraumatic, EOM intact, conjunctiva normal, significant earwax in the left ear canal CV: Regular rate and rhythm, no murmurs rubs or gallops Pulm: Clear to auscultation bilaterally, normal work of breathing Abdomen: Soft, nondistended, bowel sounds present, no tenderness to palpation MSK: No lower extremity edema Skin: Warm and dry Neuro: Alert and oriented x3   Assessment & Plan:   See Encounters Tab for problem based charting.  Patient discussed with Dr. Heber Cherryland

## 2021-04-10 NOTE — Patient Instructions (Signed)
Take 30 mg of Cymbalta for one week then increase to 60 mg daily.   Come back to see me in about 4 weeks

## 2021-04-10 NOTE — Assessment & Plan Note (Signed)
Patient reports continued difficulty falling asleep and staying asleep.  States that she only gets a couple hours of sleep at a time.  She is a light sleeper.  Denies poor sleep hygiene.  States that trazodone has provided no help.  She was taking 150 mg daily and sometimes 300 mg with no relief of her insomnia.  States that she also stopped taking duloxetine because she did not think this was helping her at all.  Patient is requesting Ambien again.  States that she has used her boyfriend's Ambien at times and this helps.  Discussed in depth why Ambien is not recommended or preferred.  Recommended that we treat her underlying etiologies that are contributing to her poor sleep.  Boyfriend and patient both expressed that her sleep has been an issue all her life.  Discussed the realistic goals of improving her sleep quality.  Discussed treating her depression and anxiety which will likely help her sleep as well.  Assessment and plan: Believe her insomnia is due to underlying issues of anxiety and depression.  We will treat with duloxetine to see if this improves her symptoms of anxiety and depression which will hopefully improve her sleep quality.  Unlikely sleep apnea or narcolepsy, patient denies any snoring or apneic events overnight.  She does wake up with headaches but this is a daily phenomenon.    Counseled on sleep hygiene Restarting duloxetine 30 mg for 1 week and then to increase to 60 mg daily If duloxetine does not improve her symptoms can try a different SSRI and consider a sleep study

## 2021-04-10 NOTE — Assessment & Plan Note (Signed)
Patient states that rizatriptan has helped her headaches some.  States that she has headaches nearly daily that are bifrontal in nature.  States she sometimes has unilateral headaches with associated photophobia and nausea.  States that NSAIDs and Tylenol provide minimal relief.  Discussed with the patient the nature of headaches appear to be tension headaches although some have characteristics of migraines as well.  Recommended using NSAIDs for her headaches that are bilateral however to use the triptans for more severe headaches with the associated photophobia and nausea.  Patient expresses understanding.  She would like to try a different triptan.  Plan: Continue NSAIDs for tension headaches Switch rizatriptan to sumatriptan 100 mg as needed

## 2021-04-13 ENCOUNTER — Telehealth: Payer: Self-pay | Admitting: *Deleted

## 2021-04-13 NOTE — Telephone Encounter (Signed)
Information sent to [employer] for PA for Sumatriptan 100 mg tablets.  Approved 04/13/2021 thru 10/10/2021 .PA [id number] Sander Nephew, RN 04/13/2021 2:53 PM.

## 2021-04-14 ENCOUNTER — Ambulatory Visit (HOSPITAL_COMMUNITY): Admission: RE | Admit: 2021-04-14 | Payer: Medicaid Other | Source: Ambulatory Visit

## 2021-04-15 NOTE — Addendum Note (Signed)
Addended by: Yvonna Alanis E on: 04/15/2021 06:30 PM   Modules accepted: Orders

## 2021-04-20 ENCOUNTER — Ambulatory Visit (INDEPENDENT_AMBULATORY_CARE_PROVIDER_SITE_OTHER): Payer: Medicaid Other | Admitting: Podiatry

## 2021-04-20 ENCOUNTER — Other Ambulatory Visit: Payer: Self-pay

## 2021-04-20 ENCOUNTER — Encounter: Payer: Medicaid Other | Admitting: Podiatry

## 2021-04-20 ENCOUNTER — Ambulatory Visit (INDEPENDENT_AMBULATORY_CARE_PROVIDER_SITE_OTHER): Payer: Medicaid Other

## 2021-04-20 DIAGNOSIS — M2041 Other hammer toe(s) (acquired), right foot: Secondary | ICD-10-CM

## 2021-04-20 DIAGNOSIS — Z9889 Other specified postprocedural states: Secondary | ICD-10-CM

## 2021-04-20 NOTE — Progress Notes (Signed)
   Subjective:  Patient presents today status post hammertoe repair 3, 4, 5 right foot. DOS: 03/19/2021.  Patient states that she is feeling well.  She has been wearing the boot weightbearing as tolerated.  No new complaints at this time  Past Medical History:  Diagnosis Date   GERD (gastroesophageal reflux disease)    Hypothyroidism 01/29/2021      Objective/Physical Exam Neurovascular status intact.  Skin incisions appear to be well coapted and healed.  No sign of infectious process noted. No dehiscence. No active bleeding noted.  Negative for any significant edema to the foot.  Toes are in good rectus alignment  Radiographic Exam:  Percutaneous fixation pins removed in their entirety.  Osteotomies with routine healing with good rectus alignment of the digits 3-5 of the foot  Assessment: 1. s/p hammertoe repair 3, 4, 5 RT foot. DOS: 03/19/2021   Plan of Care:  1. Patient was evaluated. X-rays reviewed.  Percutaneous fixation pins removed today 2.  Patient may begin to transition out of the cam boot into good supportive sneakers in tennis shoes 3.  Return to clinic in 4 weeks for follow-up x-ray and to discuss possibly addressing the bunion and hammertoe to the second digit since the patient is concerned about.  Potentially we can just pursue conservative treatment  Edrick Kins, DPM [facility]  Dr. Edrick Kins, DPM    [address], [place] [address]                Office [phone]  Fax [phone]

## 2021-04-28 ENCOUNTER — Ambulatory Visit: Payer: Medicaid Other | Admitting: Behavioral Health

## 2021-04-28 ENCOUNTER — Telehealth: Payer: Self-pay | Admitting: Behavioral Health

## 2021-04-28 NOTE — Telephone Encounter (Signed)
Contacted Pt for 10:00am session on telehealth today. Pt stated on second call, "We are out & cannot make the call".   Directed Pt to call [facility] & r/s session today for a more convenient time.  Dr. Theodis Shove

## 2021-04-29 ENCOUNTER — Encounter: Payer: Medicaid Other | Admitting: Internal Medicine

## 2021-05-04 NOTE — Telephone Encounter (Signed)
error 

## 2021-05-25 ENCOUNTER — Ambulatory Visit: Payer: Medicaid Other | Admitting: Podiatry

## 2021-05-27 ENCOUNTER — Ambulatory Visit (INDEPENDENT_AMBULATORY_CARE_PROVIDER_SITE_OTHER): Payer: Medicaid Other

## 2021-05-27 ENCOUNTER — Ambulatory Visit (INDEPENDENT_AMBULATORY_CARE_PROVIDER_SITE_OTHER): Payer: Medicaid Other | Admitting: Podiatry

## 2021-05-27 ENCOUNTER — Other Ambulatory Visit: Payer: Self-pay

## 2021-05-27 DIAGNOSIS — B351 Tinea unguium: Secondary | ICD-10-CM | POA: Diagnosis not present

## 2021-05-27 DIAGNOSIS — Z9889 Other specified postprocedural states: Secondary | ICD-10-CM

## 2021-05-27 DIAGNOSIS — M79675 Pain in left toe(s): Secondary | ICD-10-CM | POA: Diagnosis not present

## 2021-05-27 DIAGNOSIS — M2011 Hallux valgus (acquired), right foot: Secondary | ICD-10-CM

## 2021-05-27 DIAGNOSIS — M79674 Pain in right toe(s): Secondary | ICD-10-CM

## 2021-05-27 MED ORDER — TERBINAFINE HCL 250 MG PO TABS: 250.0000 mg | ORAL_TABLET | Freq: Every day | ORAL | 0 refills | Status: DC

## 2021-05-29 NOTE — Progress Notes (Signed)
Subjective:  Patient presents today status post hammertoe repair 3, 4, 5 right foot. DOS: 03/19/2021.  Patient states she is doing well however she is concerned that the great toe is pushing against the second toe and her foot now.  Prior to her surgery she did not have any complaints or issues regarding the bunion deformity of the right foot but now she is concerned for the bunion.  She says that now that the third fourth and fifth digits are nice and straight she is having issues with the second toe which is being crowded by the bunion deformity in the adjacent hallux.  It is very symptomatic and shoe gear and she would like to have it addressed and possible surgery to correct for it. Patient also states that she has thick discolored toenails which are elongated and hard to clip.  They are very uncomfortable when walking.  They are very unsightly as well and she would like to have them addressed.  She is concerned for possible toenail fungus  Past Medical History:  Diagnosis Date   GERD (gastroesophageal reflux disease)    Hypothyroidism 01/29/2021      Objective/Physical Exam Neurovascular status intact.  Skin incisions appear to be well coapted and healed.  Toes are in a good rectus alignment digits 3, 4, 5 right foot.  Overall the toes are well-healed. Hyperkeratotic dystrophic nails noted 1-5 bilateral with discoloration and thickening.  Findings consistent with onychomycosis of the toenails Hallux valgus deformity noted with an increased hallux abductus angle which is applying pressure to the second digit.  Symptomatic to palpation.  Radiographic Exam:  Osteotomies with routine healing with good rectus alignment of the digits 3-5 of the foot.  Increased intermetatarsal angle with increased hallux abductus angle noted on radiographic exam consistent with findings of a hallux valgus deformity.  Overall the second toe is nice and rectus.  The deformity appears to be coming from the hallux  valgus and not the second digit  Assessment: 1. s/p hammertoe repair 3, 4, 5 RT foot. DOS: 03/19/2021 2.  Hallux valgus right 3.  Onychomycosis of toenails 1-5 bilateral   Plan of Care:  1. Patient was evaluated. X-rays reviewed.   2.  The patient may now resume full activity no restrictions regarding the hammertoe repair surgery.  The toes have healed nicely.  Today we discussed the bunion deformity.  The patient states that now she would like to have the bunions corrected.  It is very symptomatic despite shoe gear modifications. 3. Today we discussed the conservative versus surgical management of the presenting pathology. The patient opts for surgical management. All possible complications and details of the procedure were explained. All patient questions were answered. No guarantees were expressed or implied. 4. Authorization for surgery was initiated today. Surgery will consist of bunionectomy with metatarsal osteotomy right 5.  Prescription for Lamisil 250 mg #90 daily.  We did discuss different treatment options including oral, topical, and laser antifungal treatment modalities and their associated efficacies and advantages and disadvantages.  The patient is otherwise healthy and denies any liver pathology.  She opts for oral antifungal medication 6.  Return to clinic 1 week postop   Edrick Kins, DPM [facility]  Dr. Edrick Kins, DPM    [address]  [place], [place] [address]                Office [phone]  Fax [phone]

## 2021-07-27 ENCOUNTER — Telehealth: Payer: Medicaid Other | Admitting: Family Medicine

## 2021-07-27 DIAGNOSIS — G43019 Migraine without aura, intractable, without status migrainosus: Secondary | ICD-10-CM | POA: Diagnosis not present

## 2021-07-27 MED ORDER — SUMATRIPTAN SUCCINATE 100 MG PO TABS: 100.0000 mg | ORAL_TABLET | Freq: Once | ORAL | 0 refills | Status: DC | PRN

## 2021-07-27 NOTE — Patient Instructions (Signed)
Call PCP to get in and discuss different migraine pain medication options.  Ask for referral to neurology if these are not working.  We hope you feel better soon.  Please use Imitrex as directed

## 2021-07-27 NOTE — Progress Notes (Signed)
Virtual Visit Consent   Emily Norton, you are scheduled for a virtual visit with a [facility] provider today.     Just as with appointments in the office, your consent must be obtained to participate.  Your consent will be active for this visit and any virtual visit you may have with one of our providers in the next 365 days.     If you have a MyChart account, a copy of this consent can be sent to you electronically.  All virtual visits are billed to your insurance company just like a traditional visit in the office.    As this is a virtual visit, video technology does not allow for your provider to perform a traditional examination.  This may limit your provider's ability to fully assess your condition.  If your provider identifies any concerns that need to be evaluated in person or the need to arrange testing (such as labs, EKG, etc.), we will make arrangements to do so.     Although advances in technology are sophisticated, we cannot ensure that it will always work on either your end or our end.  If the connection with a video visit is poor, the visit may have to be switched to a telephone visit.  With either a video or telephone visit, we are not always able to ensure that we have a secure connection.     I need to obtain your verbal consent now.   Are you willing to proceed with your visit today?    LACHINA SALSBERRY has provided verbal consent on 07/27/2021 for a virtual visit (video or telephone).   Perlie Mayo, NP   Date: 07/27/2021 8:23 AM   Virtual Visit via Video Note   I, Perlie Mayo, connected with  Emily Norton  ([id number], 12/24/1975) on 07/27/21 at  8:30 AM EST by a video-enabled telemedicine application and verified that I am speaking with the correct person using two identifiers.  Location: Patient: Virtual Visit Location Patient: Home Provider: Virtual Visit Location Provider: Home Office   I discussed the limitations of evaluation and management by  telemedicine and the availability of in person appointments. The patient expressed understanding and agreed to proceed.    History of Present Illness: Emily Norton is a 45 y.o. who identifies as a female who was assigned female at birth, and is being seen today for migraine.   HPI: Last week has been having headaches.  Went to [facility]- got a shot in hip- unsure of medication- it helped some.  Gave sumatriptan to help- which it did. But the headache has came back.   Light sensitive.   Migraine  This is a chronic problem. The current episode started 1 to 4 weeks ago. The problem occurs constantly. The problem has been gradually worsening. The pain does not radiate. The pain quality is similar to prior headaches. The quality of the pain is described as aching. The pain is at a severity of 8/10. The pain is moderate. Associated symptoms include nausea, phonophobia and photophobia. She has tried triptans, NSAIDs and darkened room (caffiene, food, and sleep) for the symptoms. The treatment provided mild relief. Her past medical history is significant for migraine headaches.    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 Take one capsule (30 mg) daily for 7 days, then increase to 2  capsules (60 mg) daily, Disp: 30 capsule, Rfl: 2   oxyCODONE-acetaminophen (PERCOCET) 5-325 MG tablet, Take 1 tablet by mouth every 4 (four) hours as needed for severe pain., Disp: 30 tablet, Rfl: 0   SUMAtriptan (IMITREX) 100 MG tablet, Take 1 tablet (100 mg total) by mouth once as needed for migraine. May repeat in 2 hours if headache persists or recurs., Disp: 30 tablet, Rfl: 2   terbinafine (LAMISIL) 250 MG tablet, Take 1 tablet (250 mg total) by mouth daily., Disp: 90 tablet, Rfl: 0  Observations/Objective: Patient is well-developed, well-nourished in no acute distress.  Resting comfortably  at home.  Head is normocephalic, atraumatic.  No labored breathing.  Speech is clear and coherent with logical content.  Patient is alert and oriented at baseline.    Assessment and Plan: 1. Intractable migraine without aura and without status migrainosus Refill of medication- appears to be taking it a lot more than desired- discussed the right way to take medication.  Advised to ask PCP for neurology referral for migraines- and discuss med adjustment in the mean time.   - SUMAtriptan (IMITREX) 100 MG tablet; Take 1 tablet (100 mg total) by mouth once as needed for migraine. May repeat in 2 hours if headache persists or recurs.  Dispense: 20 tablet; Refill: 0   Reviewed side effects, risks and benefits of medication.    Patient acknowledged agreement and understanding of the plan.   I discussed the assessment and treatment plan with the patient. The patient was provided an opportunity to ask questions and all were answered. The patient agreed with the plan and demonstrated an understanding of the instructions.   The patient was advised to call back or seek an in-person evaluation if the symptoms worsen or if the condition fails to improve as anticipated.   The above assessment and management plan was discussed with the patient. The patient verbalized understanding of and has agreed to the  management plan. Patient is aware to call the clinic if symptoms persist or worsen. Patient is aware when to return to the clinic for a follow-up visit. Patient educated on when it is appropriate to go to the emergency department.    Follow Up Instructions: I discussed the assessment and treatment plan with the patient. The patient was provided an opportunity to ask questions and all were answered. The patient agreed with the plan and demonstrated an understanding of the instructions.  A copy of instructions were sent to the patient via MyChart unless otherwise noted below.     The patient was advised to call back or seek an in-person evaluation if the symptoms worsen or if the condition fails to improve as anticipated.  Time:  I spent 10 minutes with the patient via telehealth technology discussing the above problems/concerns.    Perlie Mayo, NP

## 2021-08-05 ENCOUNTER — Telehealth: Payer: Self-pay | Admitting: Urology

## 2021-08-05 NOTE — Telephone Encounter (Signed)
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 I have informed Shelly, Dr. Amalia Hailey and Caren Griffins with [facility] of this change.

## 2021-08-12 ENCOUNTER — Encounter: Payer: Medicaid Other | Admitting: Podiatry

## 2021-08-19 ENCOUNTER — Encounter: Payer: Medicaid Other | Admitting: Podiatry

## 2021-09-02 ENCOUNTER — Encounter: Payer: Medicaid Other | Admitting: Podiatry

## 2021-09-24 ENCOUNTER — Other Ambulatory Visit: Payer: Self-pay | Admitting: Podiatry

## 2021-09-24 ENCOUNTER — Encounter: Payer: Self-pay | Admitting: Podiatry

## 2021-09-24 DIAGNOSIS — M2011 Hallux valgus (acquired), right foot: Secondary | ICD-10-CM | POA: Diagnosis not present

## 2021-09-24 MED ORDER — OXYCODONE-ACETAMINOPHEN 5-325 MG PO TABS: 1.0000 | ORAL_TABLET | Freq: Four times a day (QID) | ORAL | 0 refills | Status: DC | PRN

## 2021-09-24 MED ORDER — IBUPROFEN 600 MG PO TABS: 600.0000 mg | ORAL_TABLET | Freq: Three times a day (TID) | ORAL | 0 refills | Status: AC | PRN

## 2021-09-24 MED ORDER — ONDANSETRON HCL 4 MG PO TABS: 4.0000 mg | ORAL_TABLET | Freq: Three times a day (TID) | ORAL | 0 refills | Status: DC | PRN

## 2021-09-30 ENCOUNTER — Ambulatory Visit (INDEPENDENT_AMBULATORY_CARE_PROVIDER_SITE_OTHER): Payer: Medicaid Other

## 2021-09-30 ENCOUNTER — Other Ambulatory Visit: Payer: Self-pay

## 2021-09-30 ENCOUNTER — Ambulatory Visit (INDEPENDENT_AMBULATORY_CARE_PROVIDER_SITE_OTHER): Payer: Medicaid Other | Admitting: Podiatry

## 2021-09-30 DIAGNOSIS — M2011 Hallux valgus (acquired), right foot: Secondary | ICD-10-CM

## 2021-09-30 DIAGNOSIS — Z9889 Other specified postprocedural states: Secondary | ICD-10-CM

## 2021-10-03 NOTE — Progress Notes (Signed)
° °  Subjective:  Patient presents today status post bunionectomy with osteotomy right foot. DOS: 09/24/2021.  Patient states that she is doing well.  She is kept the dressings clean dry and intact.  She continues to be minimal weightbearing in the cam boot.  No new complaints at this time  Past Medical History:  Diagnosis Date   GERD (gastroesophageal reflux disease)    Hypothyroidism 01/29/2021      Objective/Physical Exam Neurovascular status intact.  Skin incisions appear to be well coapted with sutures intact. No sign of infectious process noted. No dehiscence. No active bleeding noted. Moderate edema noted to the surgical extremity.  Radiographic Exam:  Orthopedic hardware and osteotomies sites appear to be stable with routine healing.  Good reduction of the intermetatarsal angle  Assessment: 1. s/p bunionectomy with osteotomy right. DOS: 09/24/2021 2.  S/P hammertoe repair 3, 4, 5 RT foot.  DOS: 03/19/2021   Plan of Care:  1. Patient was evaluated. X-rays reviewed 2.  Dressings changed.  Keep clean dry and intact x1 week 3.  Continue minimal weightbearing in the cam boot 4.  Return to clinic 1 week for suture removal   Edrick Kins, DPM [facility]  Dr. Edrick Kins, DPM    [address],  [address]                Office [phone]  Fax [phone]

## 2021-10-06 ENCOUNTER — Telehealth: Payer: Self-pay | Admitting: Podiatry

## 2021-10-06 NOTE — Telephone Encounter (Signed)
Patient called and requested an order for a cane.   Her boyfriend stated he was going to call the facility and have something faxed over to have Dr. Amalia Hailey to sign

## 2021-10-07 ENCOUNTER — Encounter: Payer: Medicaid Other | Admitting: Podiatry

## 2021-10-12 ENCOUNTER — Encounter: Payer: Medicaid Other | Admitting: Podiatry

## 2021-10-19 ENCOUNTER — Ambulatory Visit (INDEPENDENT_AMBULATORY_CARE_PROVIDER_SITE_OTHER): Payer: Medicaid Other | Admitting: Podiatry

## 2021-10-19 ENCOUNTER — Other Ambulatory Visit: Payer: Self-pay

## 2021-10-19 ENCOUNTER — Encounter: Payer: Self-pay | Admitting: Podiatry

## 2021-10-19 DIAGNOSIS — Z9889 Other specified postprocedural states: Secondary | ICD-10-CM

## 2021-10-19 NOTE — Progress Notes (Signed)
° °  Subjective:  Emily Norton presents today status post bunionectomy with osteotomy right foot. DOS: 09/24/2021.  Emily Norton states that she is doing well.  She is kept the dressings clean dry and intact.  She continues to be minimal weightbearing in the cam boot.  No new complaints at this time  Past Medical History:  Diagnosis Date   GERD (gastroesophageal reflux disease)    Hypothyroidism 01/29/2021      Objective/Physical Exam Neurovascular status intact.  Skin incisions appear to be well coapted with sutures intact. No sign of infectious process noted. No dehiscence. No active bleeding noted. Moderate edema noted to the surgical extremity.  Radiographic Exam:  Orthopedic hardware and osteotomies sites continue to appear to be stable with routine healing.  Good reduction of the intermetatarsal angle.  Orthopedic screws are intact.  There is some change in the alignment of the screws however overall there is good demonstration of healing  Assessment: 1. s/p bunionectomy with osteotomy right. DOS: 09/24/2021 2.  S/P hammertoe repair 3, 4, 5 RT foot.  DOS: 03/19/2021    Plan of Care:  1. Emily Norton was evaluated. X-rays reviewed 2.  Sutures removed. 3.  Continue the cam boot x1 additional week after that the Emily Norton may slowly resume and transition into good supportive sneakers and shoes 4.  The Emily Norton states that they are potentially moving to [place] temporarily for work.  I recommended that they follow-up when they return from [place]. 5.  Return to clinic as needed  Edrick Kins, DPM [facility]  Dr. Edrick Kins, DPM    [address], [place] [address]                Office [phone]  Fax [phone]

## 2021-10-21 ENCOUNTER — Encounter: Payer: Medicaid Other | Admitting: Podiatry

## 2021-10-23 ENCOUNTER — Other Ambulatory Visit: Payer: Self-pay | Admitting: Obstetrics

## 2021-10-23 DIAGNOSIS — R921 Mammographic calcification found on diagnostic imaging of breast: Secondary | ICD-10-CM

## 2021-11-11 ENCOUNTER — Ambulatory Visit: Payer: Medicaid Other | Admitting: Podiatry

## 2021-11-18 ENCOUNTER — Other Ambulatory Visit: Payer: Medicaid Other

## 2021-12-10 ENCOUNTER — Other Ambulatory Visit: Payer: Medicaid Other

## 2021-12-17 ENCOUNTER — Encounter: Payer: Self-pay | Admitting: Student

## 2021-12-17 ENCOUNTER — Ambulatory Visit: Payer: Medicaid Other | Admitting: Student

## 2021-12-17 VITALS — BP 113/79 | HR 104 | Temp 98.6°F | Ht <= 58 in | Wt 125.0 lb

## 2021-12-17 DIAGNOSIS — K219 Gastro-esophageal reflux disease without esophagitis: Secondary | ICD-10-CM | POA: Diagnosis not present

## 2021-12-17 DIAGNOSIS — R1310 Dysphagia, unspecified: Secondary | ICD-10-CM | POA: Diagnosis not present

## 2021-12-17 DIAGNOSIS — Z52008 Unspecified donor, other blood: Secondary | ICD-10-CM

## 2021-12-17 DIAGNOSIS — G4700 Insomnia, unspecified: Secondary | ICD-10-CM | POA: Diagnosis not present

## 2021-12-17 DIAGNOSIS — G43019 Migraine without aura, intractable, without status migrainosus: Secondary | ICD-10-CM

## 2021-12-17 MED ORDER — ZOLPIDEM TARTRATE 5 MG PO TABS: 5.0000 mg | ORAL_TABLET | Freq: Every evening | ORAL | 0 refills | Status: DC | PRN

## 2021-12-17 MED ORDER — SUMATRIPTAN SUCCINATE 100 MG PO TABS: 100.0000 mg | ORAL_TABLET | Freq: Once | ORAL | 0 refills | Status: DC | PRN

## 2021-12-17 NOTE — Assessment & Plan Note (Signed)
Patient complaining of continued symptoms of insomnia with difficulty falling asleep and staying asleep. She has tried trazodone and cymbalta in the past with no help. Reviewed appropriate sleep hygiene and she reports that she does follow this. States that Emily Norton has worked for her in the past and insomnia is now significantly impacting her quality of life. Patient has underlying anxiety and depression, but currently does not meet DSM-5 criteria for depression and she has not been taking cymbalta at home. It does not appear that her insomnia is driven by underlying anxiety or depression. Given that she is having difficulty with sleep onset and maintenance, likely low benefit with melatonin or ramelteon. Given that she is relatively young and not on any other controlled medications at this time, it would be appropriate to trial low dose ambien ('5mg'$ ) and assess response. Will provide 30-day supply and have her follow up with her PCP for further evaluation. ? ?Plan: ?-stopped cymbalta (not taking) ?-start ambien '5mg'$  qhs prn ?

## 2021-12-17 NOTE — Patient Instructions (Signed)
Emily Norton, ? ?It was a pleasure seeing you in the clinic today.  ? ?I have placed a referral to GI for further evaluation. ?I have prescribed low dose of ambien to help with sleep. Please make sure to only use this as needed. Please follow up with Dr. Laural Golden about this. ?I have filled out the plasma donation paperwork. ? ?Please call our clinic at [phone] if you have any questions or concerns. The best time to call is Monday-Friday from 9am-4pm, but there is someone available 24/7 at the same number. If you need medication refills, please notify your pharmacy one week in advance and they will send [place] a request. ?  ?Thank you for letting us take part in your care. We look forward to seeing you next time! ? ?

## 2021-12-17 NOTE — Assessment & Plan Note (Signed)
Patient requesting completion of plasma donation paperwork. Paperwork requesting information on prior cardiothoracic surgery but unfortunately we do not have the medical records for this (surgery was done in [place]). Filled out donor form based on current records and attached letter stating that we do not have records for cardiothoracic surgery. ?

## 2021-12-17 NOTE — Progress Notes (Signed)
? ?  CC: dysphagia, insomnia ? ?HPI: ? ?[name] is a 46 y.o. female with history listed below presenting to the [facility] for dysphagia, insomnia follow up. Please see individualized problem based charting for full HPI. ? ?Past Medical History:  ?Diagnosis Date  ? GERD (gastroesophageal reflux disease)   ? Hypothyroidism 01/29/2021  ? ? ?Review of Systems:  Negative aside from that listed in individualized problem based charting. ? ?Physical Exam: ? ?Vitals:  ? 12/17/21 1525  ?BP: 113/79  ?Pulse: (!) 104  ?Temp: 98.6 ?F (37 ?C)  ?TempSrc: Oral  ?SpO2: 99%  ?Weight: 125 lb (56.7 kg)  ?Height: '4\' 8"'$  (1.422 m)  ? ?Physical Exam ?Constitutional:   ?   Appearance: Normal appearance. She is not ill-appearing.  ?HENT:  ?   Mouth/Throat:  ?   Mouth: Mucous membranes are moist.  ?   Pharynx: Oropharynx is clear.  ?Eyes:  ?   Extraocular Movements: Extraocular movements intact.  ?   Conjunctiva/sclera: Conjunctivae normal.  ?   Pupils: Pupils are equal, round, and reactive to light.  ?Cardiovascular:  ?   Rate and Rhythm: Regular rhythm. Tachycardia present.  ?   Pulses: Normal pulses.  ?   Heart sounds: Normal heart sounds. No murmur heard. ?  No gallop.  ?Pulmonary:  ?   Effort: Pulmonary effort is normal.  ?   Breath sounds: Normal breath sounds. No wheezing or rales.  ?Abdominal:  ?   General: Bowel sounds are normal. There is no distension.  ?   Palpations: Abdomen is soft.  ?   Tenderness: There is no abdominal tenderness.  ?Musculoskeletal:     ?   General: Normal range of motion.  ?Skin: ?   General: Skin is warm and dry.  ?   Comments: Healed midline surgical scar on chest.  ?Neurological:  ?   General: No focal deficit present.  ?   Mental Status: She is alert and oriented to person, place, and time.  ?Psychiatric:     ?   Mood and Affect: Mood normal.     ?   Behavior: Behavior normal.  ? ? ? ?Assessment & Plan:  ? ?See Encounters Tab for problem based charting. ? ?Patient discussed with Dr. Philipp Ovens ? ?

## 2021-12-17 NOTE — Assessment & Plan Note (Signed)
Patient reporting longstanding history of dysphagia. States that she was evaluated for this when she used to stay in [place]. She underwent esophageal balloon dilatation with no improvement and required cardiothoracic surgery (has healed midline scar on chest) for management. She was told at that time that she had blood vessels that were tightening around her esophagus and causing dysphagia. We have attempted to request records for further clarification but have been unable to obtain them. States that her symptoms are more notable with solid foods. No difficulty with swallowing liquids. 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 Given continued dysphagia and inability to obtain prior records, will refer her to GI for further evaluation. ? ?Plan: ?-ambulatory referral to GI ?

## 2021-12-18 ENCOUNTER — Telehealth: Payer: Self-pay

## 2021-12-18 NOTE — Telephone Encounter (Signed)
PA  for pt ( SUMATRIPTAN SUCCINATE ) came through from the pharmacy was submitted to [facility] was last office visit .. ? ? ? ?  tracks will sent approval or denial directly to patients home ?

## 2021-12-21 NOTE — Addendum Note (Signed)
Addended by: Jodean Lima on: 12/21/2021 01:51 PM ? ? Modules accepted: Level of Service ? ?

## 2021-12-21 NOTE — Progress Notes (Signed)
Internal Medicine Clinic Attending  Case discussed with Dr. Jinwala  At the time of the visit.  We reviewed the resident's history and exam and pertinent patient test results.  I agree with the assessment, diagnosis, and plan of care documented in the resident's note.  

## 2021-12-23 ENCOUNTER — Other Ambulatory Visit: Payer: Medicaid Other

## 2021-12-31 ENCOUNTER — Other Ambulatory Visit: Payer: Self-pay

## 2021-12-31 ENCOUNTER — Encounter: Payer: Self-pay | Admitting: Internal Medicine

## 2021-12-31 ENCOUNTER — Ambulatory Visit: Payer: Medicaid Other | Admitting: Internal Medicine

## 2021-12-31 VITALS — BP 127/70 | HR 89 | Temp 98.0°F | Ht <= 58 in | Wt 123.0 lb

## 2021-12-31 DIAGNOSIS — E039 Hypothyroidism, unspecified: Secondary | ICD-10-CM | POA: Diagnosis not present

## 2021-12-31 DIAGNOSIS — E876 Hypokalemia: Secondary | ICD-10-CM | POA: Diagnosis not present

## 2021-12-31 DIAGNOSIS — H6120 Impacted cerumen, unspecified ear: Secondary | ICD-10-CM | POA: Insufficient documentation

## 2021-12-31 DIAGNOSIS — G4700 Insomnia, unspecified: Secondary | ICD-10-CM

## 2021-12-31 DIAGNOSIS — D509 Iron deficiency anemia, unspecified: Secondary | ICD-10-CM

## 2021-12-31 DIAGNOSIS — H6123 Impacted cerumen, bilateral: Secondary | ICD-10-CM | POA: Diagnosis not present

## 2021-12-31 DIAGNOSIS — F411 Generalized anxiety disorder: Secondary | ICD-10-CM | POA: Diagnosis not present

## 2021-12-31 DIAGNOSIS — K219 Gastro-esophageal reflux disease without esophagitis: Secondary | ICD-10-CM

## 2021-12-31 DIAGNOSIS — G43019 Migraine without aura, intractable, without status migrainosus: Secondary | ICD-10-CM

## 2021-12-31 DIAGNOSIS — F331 Major depressive disorder, recurrent, moderate: Secondary | ICD-10-CM | POA: Diagnosis not present

## 2021-12-31 MED ORDER — NURTEC 75 MG PO TBDP: 75.0000 mg | ORAL_TABLET | Freq: Every day | ORAL | 1 refills | Status: AC | PRN

## 2021-12-31 MED ORDER — HYDROXYZINE HCL 25 MG PO TABS: 25.0000 mg | ORAL_TABLET | Freq: Three times a day (TID) | ORAL | 0 refills | Status: DC | PRN

## 2021-12-31 MED ORDER — PANTOPRAZOLE SODIUM 40 MG PO TBEC: 40.0000 mg | DELAYED_RELEASE_TABLET | Freq: Every day | ORAL | 1 refills | Status: DC

## 2021-12-31 MED ORDER — ESCITALOPRAM OXALATE 10 MG PO TABS: 10.0000 mg | ORAL_TABLET | Freq: Every day | ORAL | 2 refills | Status: DC

## 2021-12-31 NOTE — Assessment & Plan Note (Signed)
Patient has a long history of migraines (>6 months) and reports that she gets them once to twice a week. She describes getting headaches on the right side of her forehead that are associated with photophobia and nausea. When she gets these migraines, she often has to lay down in a dark room to get some relief. She has previously taken rizatriptan, however, this did not completely help her migraines to go away. Additionally, she took sumatriptan after that, but did not get any relief from sumatriptan. Given that patient has failed two separate triptan medications, we will start her on nurtec 75 mg to take prn for her migraines. We will use this as abortive therapy now, and if her migraines increase in frequency, we could consider nurtec for preventative therapy as well.  ? ?Will initiate prior authorization if needed for Nurtec or could consider copay card. ? ?Plan: ?- Referral to neurology ?- Nurtec 75 mg daily prn for migraines ?

## 2021-12-31 NOTE — Assessment & Plan Note (Signed)
Patient reports continued symptoms of insomnia, specifically with difficulty falling asleep and staying asleep. She has tried trazodone and cymbalta in the past, with no relief. Additionally, she was most recently started on ambien 5 mg qhs and states that this has not helped her sleep either. She states that she only gets a few hours of sleep per night and it is never restful, as she wakes up in the morning feeling restless and agitated. The patient even reports taking up to 15 mg of ambien some days, which still has not helped her sleep. Counseled patient on the dangers of taking more than the prescribed doses of medications, especially ambien, and she understands.  ?At this time, we will discontinue ambien and will start SSRI and prn hydroxyzine (see GAD assessment and plan). In the future, if insomnia does not improve with treatment of her underlying anxiety and depression, could consider remeron, risteral, or lunesta for sleep.  ?

## 2021-12-31 NOTE — Assessment & Plan Note (Signed)
In addition to having severe GAD (see GAD assessment and plan), the patient has severe depression. Her PHQ-9 score is 17. The patient specifically endorses having difficulty sleeping, feeling down more than half of the days per week, difficulty sleeping/staying asleep, difficulty concentrating, poor appetite, and feeling restless/agitated. The patient notes that she previously had thoughts of harming herself, but has not had these thoughts recently. She denies any prior suicide attempts or plans at this time. For further details, please see A/P for GAD. ? ?Plan: ?- Lexapro 10 mg daily, follow up in 6 weeks (possible dose titration) ?- Referral to psychiatry ?

## 2021-12-31 NOTE — Assessment & Plan Note (Addendum)
Patient has severe generalized anxiety with multiple life stressors. She has had excessive anxiety and worry occurring more days than not for >6 months, she cannot control the worry, and she is restless, has difficulty concentrating on things/often her mind will go blank, has irritability (mostly in the mornings), and has sleep disturbances (difficulty falling asleep and staying asleep). This anxiety and worry impairs the patient in her daily activities, limiting her ability to drive and to be out in crowded public places. She often misplaces things and when she does so, she states that this causes her significant distress. Additionally, the patient has previously followed with Dr. Theodis Shove, however, she believes that this has not helped her mood/anxiety. She has been taking ambien for sleep, and previously took trazodone, however, she reports that these medications have not helped her. The patient's boyfriend, Shanon Brow, is present with her and also reports noticing that her anxiety has limited her quality of life and ability to be out in public. The patient may also have other underlying psychiatric disorders, however, I will refer her to psychiatry for formal diagnosis (questionable dependent personality disorder, bipolar, ADHD).  ? ?Plan: ?- Start lexapro 10 mg daily (likely will need to increase dose at next visit) ?- Hydroxyzine 25 mg tid prn for anxiety ?- Follow up in 6 weeks ?- Referral to psychiatry ? ?

## 2021-12-31 NOTE — Patient Instructions (Signed)
Thank you, Ms.Kaylani R Hofland for allowing [place] to provide your care today. Today we discussed: ? ?Anxiety and depression and sleep issues: Please STOP taking ambien. You can start taking lexapro once a day to help your mood (and hopefully sleep). You can also take hydroxyzine up to three times a day to help with anxiety - this can also help with sleep too. I have also referred you to a psychiatrist ? ?Migraines: I have sent in an order for Nurtec - you can take this once a a day as needed for your migraines. If your insurance denies this, their website has copay cards so you can get this medicine for $0 ? ?GERD: The referral to the GI doctors was already placed. Please start taking protonix 40 mg once a day (before your biggest meal) ? ?Iron deficiency: We are checking lab work today ? ?I have ordered the following labs for you: ? ?Lab Orders    ?     CBC no Diff    ?     Ferritin    ?     Iron and IBC ([id number])    ?     BMP8+Anion Gap     ? ?Referrals ordered today:  ? ?Referral Orders    ?     Ambulatory referral to Psychiatry    ?     Ambulatory referral to Neurology     ? ?I have ordered the following medication/changed the following medications:  ? ?Stop the following medications: ?Medications Discontinued During This Encounter  ?Medication Reason  ? SUMAtriptan (IMITREX) 100 MG tablet Change in therapy  ? zolpidem (AMBIEN) 5 MG tablet   ?  ? ?Start the following medications: ?Meds ordered this encounter  ?Medications  ? escitalopram (LEXAPRO) 10 MG tablet  ?  Sig: Take 1 tablet (10 mg total) by mouth daily.  ?  Dispense:  30 tablet  ?  Refill:  2  ? hydrOXYzine (ATARAX) 25 MG tablet  ?  Sig: Take 1 tablet (25 mg total) by mouth 3 (three) times daily as needed.  ?  Dispense:  90 tablet  ?  Refill:  0  ? Rimegepant Sulfate (NURTEC) 75 MG TBDP  ?  Sig: Take 75 mg by mouth daily as needed.  ?  Dispense:  30 tablet  ?  Refill:  1  ? pantoprazole (PROTONIX) 40 MG tablet  ?  Sig: Take 1 tablet (40 mg total)  by mouth daily.  ?  Dispense:  30 tablet  ?  Refill:  1  ?  ? ?Follow up:  6 weeks to follow up mood   ? ? ?Remember: To STOP taking ambien and please be sure to not take more than the prescribed amount of your medicines, as this can be very dangerous ? ?Should you have any questions or concerns please call the internal medicine clinic at [phone].   ? ? ?Buddy Duty, D.O. ?[place] ? ? ?

## 2021-12-31 NOTE — Progress Notes (Signed)
? ?  CC: anxiety, depression, insomnia  ? ?HPI: ? ?[name] is a 46 y.o. female with GERD, depression, and GAD who presents to the [facility] for anxiety, depression, and insomnia. Please see problem-based list for further details, assessments, and plans. ? ? ?Past Medical History:  ?Diagnosis Date  ? GERD (gastroesophageal reflux disease)   ? Hypokalemia 02/03/2021  ? Hypothyroidism 01/29/2021  ? Thrombocytopenia ([place]) 04/16/2019  ? ?Review of Systems:  Review of Systems  ?HENT: Negative.    ?Eyes:  Negative for blurred vision and double vision.  ?Respiratory: Negative.    ?Cardiovascular: Negative.   ?Gastrointestinal:  Positive for heartburn. Negative for diarrhea, nausea and vomiting.  ?Musculoskeletal: Negative.   ?Neurological:  Positive for headaches. Negative for dizziness.  ?Psychiatric/Behavioral:  Positive for depression. Negative for hallucinations, substance abuse and suicidal ideas. The patient is nervous/anxious and has insomnia.   ? ? ?Physical Exam: ? ?Vitals:  ? 12/31/21 0942  ?BP: 127/70  ?Pulse: 89  ?Temp: 98 ?F (36.7 ?C)  ?TempSrc: Oral  ?SpO2: 100%  ?Weight: 123 lb (55.8 kg)  ?Height: '4\' 8"'$  (1.422 m)  ? ?General: Pleasant, anxious appearing female. No acute distress. ?HENT: Bilateral cerumen impaction ?CV: RRR. No murmurs, rubs, or gallops.  ?Pulmonary: Lungs CTAB. Normal effort. No wheezing or rales. ?Abdominal: Soft, nontender, nondistended. Normal bowel sounds. ?Extremities: Normal bulk and tone. Normal ROM. ?Skin: Warm and dry. Healed midline surgical scar over chest.  ?Neuro: A&Ox3. Moves all extremities. Normal sensation. No focal deficit. ?Psych: Normal mood and affect ? ? ?Assessment & Plan:  ? ?See Encounters Tab for problem based charting. ? ?Patient discussed with Dr. Philipp Ovens ? ? ? ?

## 2021-12-31 NOTE — Assessment & Plan Note (Addendum)
Patient has a long history of GERD and has had a previous ulcer seen on endoscopy in 2015. The patient has taken OTC medications for GERD, but reports that she still has symptoms of this. She endorses eating meals late at night and often laying down shortly after this, and we discussed that can be contributing to her GERD symptoms.  ? ?Plan: ?- Referral to GI placed at last visit ?- Start protonix 40 mg daily ?- BMP, CBC, and iron studies today ?

## 2021-12-31 NOTE — Assessment & Plan Note (Signed)
Patient reports having a feeling of fullness in her ears and has had some difficulty hearing because of this. On exam, she has bilateral cerumen impaction.  ? ? ?Plan:  ?Disimpacted ears today. Could consider Debrox ear drops in the future if this persists.  ?

## 2021-12-31 NOTE — Assessment & Plan Note (Addendum)
Patient has a history of iron deficiency anemia (she is of Mediterranean descent and reports a family history of this). She previously took OTC iron supplements, but states that she had diarrhea with this, thus she has not been taking these supplements. She has been trying to increase her iron levels through dietary modifications. 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 Will check labs today and provide letter if appropriate. ? ?Plan: ?- CBC and iron studies today ? ?ADDENDUM: ?Labs consistent with iron deficiency anemia. I have placed an urgent referral to GI at this time and have started the patient on oral iron replacement with ferrous sulfate 325 mg daily ?

## 2021-12-31 NOTE — Assessment & Plan Note (Signed)
Patient has a history of hypothyroidism, previously on Synthroid. She has not been on this medication for several years. She does endorse having low energy and fatigue at this time, but denies any constipation, cold sensitivity, or weight gain. ? ?Plan: ?- TSH today ?

## 2022-01-01 LAB — CBC
Hematocrit: 29.1 % — ABNORMAL LOW (ref 34.0–46.6)
Hemoglobin: 9.1 g/dL — ABNORMAL LOW (ref 11.1–15.9)
MCH: 25 pg — ABNORMAL LOW (ref 26.6–33.0)
MCHC: 31.3 g/dL — ABNORMAL LOW (ref 31.5–35.7)
MCV: 80 fL (ref 79–97)
Platelets: 191 10*3/uL (ref 150–450)
RBC: 3.64 x10E6/uL — ABNORMAL LOW (ref 3.77–5.28)
RDW: 15.2 % (ref 11.7–15.4)
WBC: 4 10*3/uL (ref 3.4–10.8)

## 2022-01-01 LAB — IRON AND TIBC
Iron Saturation: 4 % — CL (ref 15–55)
Iron: 18 ug/dL — ABNORMAL LOW (ref 27–159)
Total Iron Binding Capacity: 404 ug/dL (ref 250–450)
UIBC: 386 ug/dL (ref 131–425)

## 2022-01-01 LAB — FERRITIN: Ferritin: 7 ng/mL — ABNORMAL LOW (ref 15–150)

## 2022-01-01 LAB — BMP8+ANION GAP
Anion Gap: 12 mmol/L (ref 10.0–18.0)
BUN/Creatinine Ratio: 18 (ref 9–23)
BUN: 12 mg/dL (ref 6–24)
CO2: 24 mmol/L (ref 20–29)
Calcium: 8.3 mg/dL — ABNORMAL LOW (ref 8.7–10.2)
Chloride: 106 mmol/L (ref 96–106)
Creatinine, Ser: 0.66 mg/dL (ref 0.57–1.00)
Glucose: 85 mg/dL (ref 70–99)
Potassium: 3.9 mmol/L (ref 3.5–5.2)
Sodium: 142 mmol/L (ref 134–144)
eGFR: 109 mL/min/{1.73_m2} (ref >59)

## 2022-01-01 LAB — TSH: TSH: 2.26 u[IU]/mL (ref 0.450–4.500)

## 2022-01-01 MED ORDER — FERROUS SULFATE 325 (65 FE) MG PO TBEC: 325.0000 mg | DELAYED_RELEASE_TABLET | Freq: Every day | ORAL | 0 refills | Status: DC

## 2022-01-01 NOTE — Progress Notes (Signed)
Internal Medicine Clinic Attending ? ?Case discussed with Dr. Raymondo Band  At the time of the visit.  We reviewed the resident?s history and exam and pertinent patient test results.  I agree with the assessment, diagnosis, and plan of care documented in the resident?s note.  ? ?Labs are consistent with iron deficiency anemia. Please start iron replacement, recommend urgent GI referral.  ?

## 2022-01-01 NOTE — Addendum Note (Signed)
Addended by: Buddy Duty on: 01/01/2022 12:14 PM ? ? Modules accepted: Orders ? ?

## 2022-01-01 NOTE — Addendum Note (Signed)
Addended by: Buddy Duty on: 01/01/2022 09:56 AM ? ? Modules accepted: Orders ? ?

## 2022-01-04 ENCOUNTER — Telehealth: Payer: Self-pay

## 2022-01-04 NOTE — Telephone Encounter (Signed)
Requesting to speak with Dr. Raymondo Band for lab results. Please call pt back.  ?

## 2022-01-05 ENCOUNTER — Other Ambulatory Visit: Payer: Medicaid Other

## 2022-01-06 ENCOUNTER — Encounter: Payer: Self-pay | Admitting: Gastroenterology

## 2022-01-07 ENCOUNTER — Telehealth: Payer: Self-pay

## 2022-01-07 ENCOUNTER — Telehealth: Payer: Self-pay | Admitting: Student

## 2022-01-07 ENCOUNTER — Telehealth: Payer: Self-pay | Admitting: Internal Medicine

## 2022-01-07 NOTE — Telephone Encounter (Signed)
After speaking with Dr. Lisabeth Devoid, I called patient back to discuss lab results and address any concerns she had. Patient had requested to speak to attending. ? ?Emily Norton answered the phone and I spoke to her directly. ? ?We reviewed her recent labs, which showed iron deficiency anemia. I explained the importance of taking oral ferrous sulfate. When she took this in the past, it caused diarrhea. I suggested that she try taking it 3x a week (ex- MWF) and she will try this. I also shared that she has a GI referral ordered for IDA, and she just made an appointment with GI for 5/8.  ? ?We reviewed her other labs, including calcium, which was mildly low. I encouraged her to exercise and eat foods with calcium, but I do not recommend additional medicines for this right now. ? ?I answered all questions and she appreciated the call.  ?

## 2022-01-07 NOTE — Telephone Encounter (Signed)
Called patient's mobile number today at the request of Dr. Raymondo Band as patient had questions about her labs.  A female voice answered the phone but transferred me to Ms. Viruet.  She discussed that she is spoken to Dr. Raymondo Band previously about her lab results but the call was cut off.  I started to asked where the conversation left off.  At this time, this other person started speaking over the patient stating that the patient is having many difficulties with her health and it has been very difficult as she has been seen by multiple providers at the [facility].  I expressed understanding at their frustration.  I let him know Dr. Raymondo Band is not in the clinic today and I can help with answering questions.  I also discussed that while we are not able to ensure the patient always sees the same provider at the [facility] we are happy to help with transferring her care to another provider at a different clinic who could.  I was then asked if I was also an Theatre manager.  I confirmed that I am a resident working in the clinic.   ? ?I attempted to redirect the call back to Ms. Blas and ask her what questions she had about her labs and started going over her over the studies from her last visit on 4/27 with Dr. Raymondo Band.  I was again interrupted by the same person who answered the call who stated I needed to "just go over the labs and order MRI for the patient prior to her neurology appointment".  He then asked whether or not I appreciated the fact that its been difficult because the patient has had a different physician every time she is in clinic.  I again expressed empathy Ms. Books  has not been able to see single provider and again offered the choice for the patient to be transferred to another office where she can have a single primary care provider.  ? ? At this point the other person on the call started to interrupt and was very vocal about how I was not appreciating the lack of continuity of care.  He also complained I was unaware of what was  previously discussed with Dr. Terrall Laity.  I again attempted to redirect conversation to the patient and started to go over labs with the patient. Unfortunately we continued to be interrupted and he continued to repeat complaints listed above the topics discussed before.  He then demanded to talk to my supervisor.  I informed him that I can redirect him to one of my attendings after lunch.  Unfortunately was unable to speak further with the patient and go over her questions. Discussed this with Dr. Cain Sieve who will attempt to call the patient again.  ?

## 2022-01-07 NOTE — Telephone Encounter (Signed)
Pa for pt ( NURTEC 75 MG TAB ) came through via fax  from pharmacy .. was submitted to [place] track @ [phone] ?

## 2022-01-11 ENCOUNTER — Telehealth: Payer: Self-pay | Admitting: Family Medicine

## 2022-01-11 ENCOUNTER — Ambulatory Visit
Admission: RE | Admit: 2022-01-11 | Discharge: 2022-01-11 | Disposition: A | Discharge status: Home / Self Care | Payer: Medicaid Other | Source: Ambulatory Visit | Attending: Obstetrics | Admitting: Obstetrics

## 2022-01-11 ENCOUNTER — Ambulatory Visit (INDEPENDENT_AMBULATORY_CARE_PROVIDER_SITE_OTHER): Payer: Medicaid Other | Admitting: Gastroenterology

## 2022-01-11 ENCOUNTER — Encounter: Payer: Self-pay | Admitting: Gastroenterology

## 2022-01-11 VITALS — BP 106/70 | HR 71 | Ht <= 58 in | Wt 121.1 lb

## 2022-01-11 DIAGNOSIS — R1319 Other dysphagia: Secondary | ICD-10-CM

## 2022-01-11 DIAGNOSIS — K219 Gastro-esophageal reflux disease without esophagitis: Secondary | ICD-10-CM

## 2022-01-11 DIAGNOSIS — R933 Abnormal findings on diagnostic imaging of other parts of digestive tract: Secondary | ICD-10-CM

## 2022-01-11 DIAGNOSIS — D509 Iron deficiency anemia, unspecified: Secondary | ICD-10-CM

## 2022-01-11 DIAGNOSIS — R921 Mammographic calcification found on diagnostic imaging of breast: Secondary | ICD-10-CM

## 2022-01-11 DIAGNOSIS — Z1211 Encounter for screening for malignant neoplasm of colon: Secondary | ICD-10-CM | POA: Diagnosis not present

## 2022-01-11 MED ORDER — CLENPIQ 10-3.5-12 MG-GM -GM/160ML PO SOLN: 1.0000 | ORAL | 0 refills | Status: DC

## 2022-01-11 NOTE — Telephone Encounter (Signed)
error 

## 2022-01-11 NOTE — Patient Instructions (Addendum)
If you are age 48 or younger, your body mass index should be between 19-25. Your Body mass index is 27.16 kg/m?Marland Kitchen If this is out of the aformentioned range listed, please consider follow up with your Primary Care Provider.  ? ?__________________________________________________________ ? ?The [facility] providers would like to encourage you to use [facility] to communicate with providers for non-urgent requests or questions.  Due to long hold times on the telephone, sending your provider a message by [facility] may be a faster and more efficient way to get a response.  Please allow 48 business hours for a response.  Please remember that this is for non-urgent requests.  ? ?Due to recent changes in healthcare laws, you may see the results of your imaging and laboratory studies on MyChart before your provider has had a chance to review them.  We understand that in some cases there may be results that are confusing or concerning to you. Not all laboratory results come back in the same time frame and the provider may be waiting for multiple results in order to interpret others.  Please give [place] 48 hours in order for your provider to thoroughly review all the results before contacting the office for clarification of your results.  ? ?You have been scheduled for an endoscopy and colonoscopy. Please follow the written instructions given to you at your visit today. ?Please pick up your prep supplies at the pharmacy within the next 1-3 days. ?If you use inhalers (even only as needed), please bring them with you on the day of your procedure.  ? ?We have sent the following medications to your pharmacy for you to pick up at your convenience: Clenpiq ? ?Hold your Iron tablets 7 days prior to your procedure. ? ?Thank you for choosing me and [facility]. ? ?Gerrit Heck, D.O. ? ?We want to thank you for trusting [facility] with your care. All of our staff and providers value the relationships we  have built with our patients, and it is an honor to care for you.  ? ?We are writing to let you know that [facility] will close on Jan 18, 2022, and we invite you to continue to see Dr. Carmell Austria and Gerrit Heck at the [facility] [place] office location. We are consolidating our serices at these [facility] practices to better provide care. Our office staff will work with you to ensure a seamless transition.  ? ?Gerrit Heck, DO -Dr. Bryan Lemma will be movig to [facility] at [address] [place], [place] [address], effective Jan 18, 2022.  Contact [phone] to schedule an appointment with him.  ? ?Carmell Austria, MD- Dr. Lyndel Safe will be movig to [facility] at [address] [place], [place] [address], effective Jan 18, 2022.  Contact [phone] to schedule an appointment with him.  ? ?Requesting Medical Records ?If you need to request your medical records, please follow the instructions below. Your medical records are confidential, and a copy can be transferred to another provider or released to you or another person you designate only with your permission. ? ?There are several ways to request your medical records: ?Requests for medical records can be submitted through our practice.   ?You can also request your records electronically, in your MyChart account by selecting the ?Request Health Records? tab.  ?If you need additional information on how to request records, please go to [email or web address]/, choose Patient Information, then select Request Medical Records. ?To make an  appointment or if you have any questions about your health care needs, please contact our office at [phone] and one of our staff members will be glad to assist you. ?[facility] is committed to providing exceptional care for you and our community. Thank you for allowing [place] to serve your health care needs. ?Sincerely, ? ?Windy Canny, Director [facility] ?[facility] also offers convenient virtual care options. Sore throat? Sinus problems? Cold or flu symptoms? Get care from the comfort of home with [facility] Video Visits and e-Visits. Learn more about the non-emergency conditions treated and start your virtual visit at [email or web address] ? ? ?

## 2022-01-11 NOTE — Progress Notes (Addendum)
? ? ?          ?Chief Complaint: Iron deficiency anemia, GERD, dysphagia ? ? ?Referring Provider:     Virl Axe, MD ? ? ?HPI:   ? ? ?Emily Norton is a 46 y.o. female with history of GERD, depression, GAD, anxiety, insomnia, migraines, hypothyroidism, thrombocytopenia, referred to the Gastroenterology Clinic for evaluation of iron deficiency anemia, GERD, and dysphagia. ? ?She reports having a longstanding history of IDA.  Dates back to at least 2013 and reports having an evaluation while living in [place], to include EGD (normal), colonoscopy (normal), and VCE (small bowel ulcer, managed medically).  No previous endoscopy reports available for review.  Previously took OTC iron supplements, complicated by diarrhea so she stopped taking.  Recent evaluation again notable for IDA.  PCM recommended restarting ferrous sulfate 325 mg/day.  She denies hematochezia, melena. ?-01/29/2021:  ?      - H/H 12.2/35.8 with MCV/RDW 91/15 ?      - Ferritin 42, iron 68 ?      - Normal CMP ?- 12/31/2021:  ?      - H/H 9.1/29.1 with MCV/RDW 80/15.2  ?      - Ferritin 7, iron 18, TIBC 404, sat 4% ?      - Normal BMP, TSH ? ?More recently with fatigue and unsteady gait. No SOB, DOE, CP.  ? ? ?Additionally, longstanding history of dysphagia.  Reports having several EGDs with dilations over the years, with last esophageal balloon dilatation in 2014 in [place].  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  No records available for review.  Now with intermittent solid food dysphagia, but no history of food impactions.  Overall, symptoms not too bothersome now.  ?-11/22/2018: CT chest: Right-sided aortic arch.  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  This is presumably secondary to repair of symptomatic vascular ring in the past.  Right subclavian and right  common carotid arteries have separate origins of the aortic arch and are patent ?- 04/10/2021: Barium esophagram: Mild narrowing and transient hold-up of contrast within the esophagus is surrounded by the right aortic arch, trachea, and conduit graft.  No hiatal hernia, no spontaneous reflux, the reflux present with water siphon test.  Barium tablet passed without difficulty. ? ?Separately, longstanding history of GERD, with index symptoms of heartburn, regurgitation, waterbrash.  Can have nocturnal choking and coughing.  She is prescribed Protonix 40 mg/day, but takes prn. Continues to mainly have nocturnal breakthrough reflux symptoms  ? ?Last colonoscopy was 2013 that normal/no polyps per patient. ? ?No previous GI records available for review.  She does not recall the name of her previous gastroenterologist or CT surgeon in [place]. ? ?Of note, patient arrives 25 minutes late for appointment today. ? ? ?Past Medical History:  ?Diagnosis Date  ? GERD (gastroesophageal reflux disease)   ? Hypokalemia 02/03/2021  ? Hypothyroidism 01/29/2021  ? Thrombocytopenia ([place]) 04/16/2019  ? ? ? ?Past Surgical History:  ?Procedure Laterality Date  ? BREAST EXCISIONAL BIOPSY Right 09/06/2012  ? (-)  ? CHOLECYSTECTOMY    ? ESOPHAGUS SURGERY    ? open heart surgery    ? thoracic surgery per pt  ? ?Family History  ?Problem Relation Age of Onset  ? Breast cancer Mother   ? Migraines Father   ? BRCA 1/2 Sister   ?  brca+. had preventive dbl mastectomy  ? Heart attack Maternal Grandfather   ? ?Social History  ? ?Tobacco Use  ? Smoking status: Never  ? Smokeless tobacco: Never  ?Vaping Use  ? Vaping Use: Never used  ?Substance Use Topics  ? Alcohol use: Yes  ?  Comment: ocassional  ? Drug use: Not Currently  ? ?Current Outpatient Medications  ?Medication Sig Dispense Refill  ? escitalopram (LEXAPRO) 10 MG tablet Take 1 tablet (10 mg total) by mouth daily. 30 tablet 2  ? ferrous sulfate 325 (65 FE) MG EC tablet Take 1 tablet (325 mg total) by  mouth daily with breakfast. 90 tablet 0  ? hydrOXYzine (ATARAX) 25 MG tablet Take 1 tablet (25 mg total) by mouth 3 (three) times daily as needed. 90 tablet 0  ? ibuprofen (ADVIL) 600 MG tablet Take 1 tablet (600 mg total) by mouth every 8 (eight) hours as needed. 30 tablet 0  ? ondansetron (ZOFRAN) 4 MG tablet Take 1 tablet (4 mg total) by mouth every 8 (eight) hours as needed for nausea or vomiting. 20 tablet 0  ? pantoprazole (PROTONIX) 40 MG tablet Take 1 tablet (40 mg total) by mouth daily. 30 tablet 1  ? Rimegepant Sulfate (NURTEC) 75 MG TBDP Take 75 mg by mouth daily as needed. 30 tablet 1  ? terbinafine (LAMISIL) 250 MG tablet Take 1 tablet (250 mg total) by mouth daily. 90 tablet 0  ? ?No current facility-administered medications for this visit.  ? ?No Active Allergies ? ? ?Review of Systems: ?All systems reviewed and negative except where noted in HPI.  ? ? ? ?Physical Exam:   ? ?Wt Readings from Last 3 Encounters:  ?01/11/22 121 lb 2 oz (54.9 kg)  ?12/31/21 123 lb (55.8 kg)  ?12/17/21 125 lb (56.7 kg)  ? ? ?Ht 4' 8"  (1.422 m)   Wt 121 lb 2 oz (54.9 kg)   BMI 27.16 kg/m?  ?Constitutional:  Pleasant, in no acute distress. ?Psychiatric: Normal mood and affect. Behavior is normal. ?Cardiovascular: Normal rate, regular rhythm. No edema ?Pulmonary/chest: Effort normal and breath sounds normal. No wheezing, rales or rhonchi. ?Abdominal: Soft, nondistended, nontender. Bowel sounds active throughout. There are no masses palpable. No hepatomegaly. ?Neurological: Alert and oriented to person place and time. ?Skin: Skin is warm and dry. No rashes noted. ? ? ?ASSESSMENT AND PLAN;  ? ?1) Iron Deficiency Anemia ?- Discussed potential GI etiologies for IDA at length with patient today.  IDA dates back to at least 2013 per patient with evaluation at that time in [place] n/f EGD (normal), colonoscopy (normal), and VCE demonstrating small bowel ulcer.  This was managed medically without need for enteroscopy.  Per her  recollection, IDA subsequently resolved, but over the last year developed IDA again.  No overt bleeding. ?- Continue ferrous sulfate as currently prescribed ?- Hold oral iron 7 days prior to endoscopic exams ?- EGD and colonoscopy for diagnostic and potentially therapeutic intent ?- If EGD/colonoscopy are unrevealing, can plan for small bowel interrogation VCE ?- Depending on response to and tolerance of oral iron, may need IV iron instead vs Hematology referral ?- Trying to obtain prior records for review ? ?2) GERD ?- Change Protonix to 40 mg predinner currently taking man) ?- Avoid eating within 3 hours of bedtime ?- Sleep with HOB elevated ?- EGD to evaluate for erosive esophagitis, hiatal hernia, LES laxity ?- Counseled on antireflux lifestyle/dietary modifications ? ?3) Dysphagia ?4) Abnormal imaging study ?- Previous cardiothoracic surgery for  what sounds like a vascular ring.  Based on recent imaging, do not think this was dysphagia lusoria.  She is otherwise tolerating p.o. intake ?- Evaluate for luminal narrowing, stricture, etc. time EGD as above with dilation as appropriate ?- If symptoms progress, referral to Cardiothoracic Surgery ?- Asked patient to obtain prior records for review ? ?5) Colon cancer screening ?- Due for age-appropriate, average risk CRC screening ?- Colonoscopy ? ?The indications, risks, and benefits of EGD and colonoscopy were explained to the patient in detail. Risks include but are not limited to bleeding, perforation, adverse reaction to medications, and cardiopulmonary compromise. Sequelae include but are not limited to the possibility of surgery, hospitalization, and mortality. The patient verbalized understanding and wished to proceed. All questions answered, referred to scheduler and bowel prep ordered. Further recommendations pending results of the exam.  ? ? ? ?Lavena Bullion, DO, FACG  01/11/2022, 10:13 AM ? ? ?Velna Ochs, MD ? ? ?Addendum: ?Medical records from Dr.  Alden Server, DO, [place], [place], [place], [place].  Last seen in the office on 08/24/2016. ? 

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
## 2022-01-26 ENCOUNTER — Telehealth: Payer: Self-pay | Admitting: Psychiatry

## 2022-01-26 ENCOUNTER — Ambulatory Visit: Payer: Medicaid Other | Admitting: Psychiatry

## 2022-01-26 ENCOUNTER — Encounter: Payer: Self-pay | Admitting: Psychiatry

## 2022-01-26 NOTE — Telephone Encounter (Signed)
Pt called, running late for appt, will be 10 mins late. Informed pt you may want to reschedule appt. Pt said want to come to appt. Informed pt If you get here 10 mins late for appt you will have to reschedule. But would be at the descretion of physician if able to be seen.

## 2022-02-02 ENCOUNTER — Encounter: Payer: Medicaid Other | Admitting: Gastroenterology

## 2022-02-02 ENCOUNTER — Telehealth: Payer: Self-pay | Admitting: Gastroenterology

## 2022-02-02 NOTE — Telephone Encounter (Signed)
SAME DAY CANCELLATION - DID NOT PREP - RESCHEDULED FOR 6/7 AT 3:30

## 2022-02-03 ENCOUNTER — Institutional Professional Consult (permissible substitution): Payer: Medicaid Other | Admitting: Plastic Surgery

## 2022-02-03 ENCOUNTER — Ambulatory Visit: Payer: Medicaid Other | Admitting: Psychiatry

## 2022-02-07 ENCOUNTER — Encounter: Payer: Self-pay | Admitting: Certified Registered Nurse Anesthetist

## 2022-02-10 ENCOUNTER — Ambulatory Visit: Payer: Medicaid Other | Admitting: Gastroenterology

## 2022-02-10 ENCOUNTER — Encounter: Payer: Self-pay | Admitting: Gastroenterology

## 2022-02-10 ENCOUNTER — Telehealth: Payer: Self-pay | Admitting: Gastroenterology

## 2022-02-10 VITALS — BP 129/73 | HR 60 | Temp 98.4°F | Ht <= 58 in | Wt 121.0 lb

## 2022-02-10 DIAGNOSIS — T884XXA Failed or difficult intubation, initial encounter: Secondary | ICD-10-CM

## 2022-02-10 HISTORY — DX: Failed or difficult intubation, initial encounter: T88.4XXA

## 2022-02-10 MED ORDER — SODIUM CHLORIDE 0.9 % IV SOLN: 500.0000 mL | Freq: Once | INTRAVENOUS | Status: DC

## 2022-02-10 NOTE — Progress Notes (Signed)
Patient and her care partner came to reception window at [facility] after the decision to cancel procedure d/t difficult airway.  The partner was upset that the procedure at the hospital could not be rescheduled right now.  CRNA W. Wall explained to the patient and care partner why the patient is not an appropriate candidate in an ambulatory setting.

## 2022-02-10 NOTE — Telephone Encounter (Signed)
Please route this to [facility] admitting-thank you

## 2022-02-10 NOTE — Telephone Encounter (Signed)
Inbound call from patients friend. Patient was also in the room. Patients friend states they have some questions about today's procedure at 3:00 pm. Neither party disclosed their questions.  Thank You

## 2022-02-10 NOTE — Telephone Encounter (Signed)
Spoke with pt and pt's friend, Shanon Brow. Both were extremely angry that procedure was canceled today. Pt continued to ask if it was policy to ask the pt if they were a difficult intubation before the procedure. Apologized to pt that procedure was canceled and informed her that it was canceled for her safety. Pt's friend stated that he needed to speak to someone who knew what they were talking about. Pt's friend stated that he previously dated a GI nurse and he learned about GI policies and procedures during that time. Pt and pt's friend expressed that they didn't have the gas money to waste to come to procedure and wanted to speak to person in charge of screening patients for procedures.

## 2022-02-10 NOTE — Progress Notes (Signed)
During pre anesthesia interview patient states she has a difficult airway and "anesthesia department in [place]" told her this.  No information located in chart, MD made aware and case canceled. Information added to present chart.

## 2022-02-10 NOTE — Progress Notes (Signed)
Patient presents for EGD and colonoscopy today.  Reports to CRNA in the preoperative area that she has been documented as a difficult airway in the past during prior procedures in [place].  That information was not available and the limited records that were forwarded from [place], and she only recalls that information since appointment in office.  Due to patient's reported difficult airway with history of difficult intubation for surgery in the past, procedure in the ambulatory setting today is canceled in favor of rescheduling at the hospital where additional advanced airway capabilities are present.  I explained these recommendations and the plan to the patient today and she is agreeable.  Will start coordinating date for EGD/colonoscopy at [facility].

## 2022-02-10 NOTE — Telephone Encounter (Signed)
Patient called requesting to speak with a nurse regarding what happened today when she came for her procedure.

## 2022-02-11 ENCOUNTER — Encounter: Payer: Self-pay | Admitting: Gastroenterology

## 2022-02-11 ENCOUNTER — Other Ambulatory Visit: Payer: Self-pay

## 2022-02-11 DIAGNOSIS — D509 Iron deficiency anemia, unspecified: Secondary | ICD-10-CM

## 2022-02-11 DIAGNOSIS — K219 Gastro-esophageal reflux disease without esophagitis: Secondary | ICD-10-CM

## 2022-02-11 NOTE — Telephone Encounter (Signed)
Spoke with pt to schedule hospital procedure and placed orders for Egd/colon. Let pt know that the next available appt I could find was August 10th. Pt and pt's boyfriend requested a call from Dr. Bryan Lemma before scheduling. Preferred number is [phone].

## 2022-02-15 NOTE — Telephone Encounter (Signed)
Thank you for the additional information.  If we are able to get a copy of her previous medical records for review, that would be helpful.  We will otherwise wait for John to weigh in on the airway issue and sedation in [place] vs [facility].  If she is agreeable, can schedule procedures to be done at [facility] in the interim while awaiting records since it is a longer waiting period to have procedures done at the hospital.

## 2022-02-15 NOTE — Telephone Encounter (Signed)
I called and spoke with Emily Norton a couple different times today.  I explained the situation in detail.  Given her reported history of difficult intubation and difficult airway in the past, I reiterated the need to do these procedures at the hospital.  We otherwise do not have records from [place] detailing anything further regarding her airway or intubation history.  I explained to given that reported history, it is in her best interest of safety to do these procedures at the hospital.  After much back-and-forth, Shanon Brow finally stated "I am not an expert like you are, but I disagree and we will pursue her care elsewhere".  He then hung up the phone.

## 2022-02-17 ENCOUNTER — Other Ambulatory Visit (HOSPITAL_COMMUNITY)
Admission: RE | Admit: 2022-02-17 | Discharge: 2022-02-17 | Disposition: A | Discharge status: Home / Self Care | Payer: Medicaid Other | Source: Ambulatory Visit | Attending: Obstetrics | Admitting: Obstetrics

## 2022-02-17 ENCOUNTER — Encounter: Payer: Self-pay | Admitting: Obstetrics

## 2022-02-17 ENCOUNTER — Ambulatory Visit (INDEPENDENT_AMBULATORY_CARE_PROVIDER_SITE_OTHER): Payer: Medicaid Other | Admitting: Obstetrics

## 2022-02-17 VITALS — BP 108/69 | HR 73 | Ht <= 58 in | Wt 123.1 lb

## 2022-02-17 DIAGNOSIS — Z0000 Encounter for general adult medical examination without abnormal findings: Secondary | ICD-10-CM

## 2022-02-17 DIAGNOSIS — Z30011 Encounter for initial prescription of contraceptive pills: Secondary | ICD-10-CM | POA: Diagnosis not present

## 2022-02-17 DIAGNOSIS — N898 Other specified noninflammatory disorders of vagina: Secondary | ICD-10-CM

## 2022-02-17 DIAGNOSIS — Z1239 Encounter for other screening for malignant neoplasm of breast: Secondary | ICD-10-CM | POA: Diagnosis not present

## 2022-02-17 DIAGNOSIS — Z01419 Encounter for gynecological examination (general) (routine) without abnormal findings: Secondary | ICD-10-CM | POA: Insufficient documentation

## 2022-02-17 NOTE — Progress Notes (Signed)
Pt presents for AEX. Last Pap: 12/31/2020- negative Last Mammogram: 01/11/2022- benign  Vaginal/Urinary symptoms: Denies any vaginal itching, irritation, odor or discharge. Denies any urinary symptoms. STD screen: Declines swab and blood work.

## 2022-02-18 ENCOUNTER — Encounter: Payer: Self-pay | Admitting: Obstetrics
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
## 2022-02-19 LAB — CERVICOVAGINAL ANCILLARY ONLY
Bacterial Vaginitis (gardnerella): NEGATIVE
Candida Glabrata: NEGATIVE
Candida Vaginitis: NEGATIVE
Chlamydia: NEGATIVE
Comment: NEGATIVE
Comment: NEGATIVE
Comment: NEGATIVE
Comment: NEGATIVE
Comment: NEGATIVE
Comment: NORMAL
Neisseria Gonorrhea: NEGATIVE
Trichomonas: NEGATIVE

## 2022-02-19 NOTE — Addendum Note (Signed)
Addended by: Baltazar Najjar A on: 02/19/2022 12:10 PM   Modules accepted: Orders

## 2022-02-22 LAB — CYTOLOGY - PAP: Diagnosis: NEGATIVE

## 2022-02-22 NOTE — Progress Notes (Unsigned)
Referring:  Rehman, Areeg N, DO [address],  [place] [address]  PCP: Mike Craze, DO  Neurology was asked to evaluate Emily Norton, a 46 year old female for a chief complaint of headaches.  Our recommendations of care will be communicated by shared medical record.    CC:  headaches  History provided from self, boyfriend  HPI:  Medical co-morbidities: depression, GAD, migraines, hypothyroidism  The patient presents for evaluation of headaches which began several years ago. Headaches are daily and essentially constant. They are described as temporal pain with associated photophobia, phonophobia, and nausea. She will have floaters in her vision associated with her headaches  She takes an over the counter caffeine pill which helps temporarily but pain always returns. Takes at least one caffeine pill per day. Also takes ibuprofen daily. She has taken Zofran previously which helps with nausea. Sumatriptan helps somewhat but relief is temporary.  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 She was told that this would need to be monitored. Unfortunately none of these records are available for review. Has not had an MRI since then.  Headache History: Onset: over 10 years ago Triggers: stress, computer screens Aura: floaters, blurry Location: temples, front Associated Symptoms:  Photophobia: yes  Phonophobia: yes  Nausea: yes Worse with activity?: yes Duration of headaches: constant   Headache days per month: 30 Headache free days per month: 0  Current Treatment: Abortive Imitrex 100 mg PRN Caffeine Ibuprofen  Preventative none  Prior Therapies                                 Imitrex 100 mg PRN Maxalt 10 mg PRN Nurtec 75 mg PRN Lexapro 10 mg  daily    LABS: CBC    Component Value Date/Time   WBC 4.0 12/31/2021 1105   RBC 3.64 (L) 12/31/2021 1105   HGB 9.1 (L) 12/31/2021 1105   HCT 29.1 (L)  12/31/2021 1105   PLT 191 12/31/2021 1105   MCV 80 12/31/2021 1105   MCH 25.0 (L) 12/31/2021 1105   MCHC 31.3 (L) 12/31/2021 1105   RDW 15.2 12/31/2021 1105   LYMPHSABS 0.8 01/29/2021 1044   EOSABS 0.2 01/29/2021 1044   BASOSABS 0.1 01/29/2021 1044      Latest Ref Rng & Units 12/31/2021   11:05 AM 03/04/2021   10:53 AM 01/29/2021   10:44 AM  CMP  Glucose 70 - 99 mg/dL 85  85  85   BUN 6 - 24 mg/dL 12  12  10    Creatinine 0.57 - 1.00 mg/dL 0.66  0.60  0.69   Sodium 134 - 144 mmol/L 142  138  143   Potassium 3.5 - 5.2 mmol/L 3.9  4.0  3.1   Chloride 96 - 106 mmol/L 106  103  101   CO2 20 - 29 mmol/L 24  21  24    Calcium 8.7 - 10.2 mg/dL 8.3  8.4  8.9   Total Protein 6.0 - 8.5 g/dL   6.9   Total Bilirubin 0.0 - 1.2 mg/dL   0.6   Alkaline Phos 44 - 121 IU/L   54   AST 0 - 40 IU/L   19   ALT 0 - 32 IU/L   12      IMAGING:  Reportedly had a cyst in her brain on  MRI 10 years ago   Current Outpatient Medications on File Prior to Visit  Medication Sig Dispense Refill   escitalopram (LEXAPRO) 10 MG tablet Take 1 tablet (10 mg total) by mouth daily. 30 tablet 2   ferrous sulfate 325 (65 FE) MG EC tablet Take 1 tablet (325 mg total) by mouth daily with breakfast. 90 tablet 0   hydrOXYzine (ATARAX) 25 MG tablet Take 1 tablet (25 mg total) by mouth 3 (three) times daily as needed. 90 tablet 0   ibuprofen (ADVIL) 600 MG tablet Take 1 tablet (600 mg total) by mouth every 8 (eight) hours as needed. 30 tablet 0   pantoprazole (PROTONIX) 40 MG tablet Take 1 tablet (40 mg total) by mouth daily. (Patient taking differently: Take 40 mg by mouth daily as needed.) 30 tablet 1   terbinafine (LAMISIL) 250 MG tablet Take 1 tablet (250 mg total) by mouth daily. 90 tablet 0   Current Facility-Administered Medications on File Prior to Visit  Medication Dose Route Frequency Provider Last Rate Last Admin   0.9 %  sodium chloride infusion  500 mL Intravenous Once Cirigliano, Vito V, DO          Allergies: No Known Allergies  Family History: Family History  Problem Relation Age of Onset   Breast cancer Mother    Migraines Father    BRCA 1/2 Sister        brca+. had preventive dbl mastectomy   Heart attack Maternal Grandfather    Breast cancer Cousin    Colon cancer Neg Hx    Esophageal cancer Neg Hx   Father had migraines   Past Medical History: Past Medical History:  Diagnosis Date   Difficult airway for intubation, initial encounter 02/10/2022   per patient and states information from [place] [facility]   GERD (gastroesophageal reflux disease)    History of toe surgery    Hypokalemia 02/03/2021   Hypothyroidism 01/29/2021   Thrombocytopenia ([facility]) 04/16/2019    Past Surgical History Past Surgical History:  Procedure Laterality Date   BREAST BIOPSY Bilateral 03/13/2021   BREAST EXCISIONAL BIOPSY Right 09/06/2012   (-)   CHOLECYSTECTOMY     [facility]  2013   [facility]   ESOPHAGOGASTRODUODENOSCOPY  2013   [facility] [place]   ESOPHAGUS SURGERY     open heart surgery  2013   thoracic surgery per pt    Social History: Social History   Tobacco Use   Smoking status: Never   Smokeless tobacco: Never  Vaping Use   Vaping Use: Never used  Substance Use Topics   Alcohol use: Yes    Comment: ocassional   Drug use: Not Currently     ROS: Negative for fevers, chills. Positive for headaches. All other systems reviewed and negative unless stated otherwise in HPI.   Physical Exam:   Vital Signs: BP 125/85   Pulse 72   Ht 4' 8"  (1.422 m)   Wt 125 lb (56.7 kg)   LMP 01/21/2022 (Approximate)   BMI 28.02 kg/m  GENERAL: well appearing,in no acute distress,alert SKIN:  Color, texture, turgor normal. No rashes or lesions HEAD:  Normocephalic/atraumatic. CV:  RRR RESP: Normal respiratory effort MSK: +tenderness to palpation over right occiput, neck, and shoulders  NEUROLOGICAL: Mental Status: Alert, oriented to person, place  and time,Follows commands Cranial Nerves: PERRL, visual fields intact to confrontation, extraocular movements intact, decreased sensation over left V1-3, no facial droop or ptosis, hearing grossly intact, no dysarthria Motor:  muscle strength 5/5 both upper and lower extremities Reflexes: 2+ throughout Sensation: Decreased sensation to light touch over left upper extremity Coordination: Finger-to- nose-finger intact bilaterally Gait: normal-based   IMPRESSION: 46 year old female with a history of depression, GAD, migraines, hypothyroidism who presents for evaluation of daily headaches. Her headache pattern is consistent with chronic migraine. She also likely has a component of medication overuse headache. Will order updated MRI brain as she has numbness of left face and arm on exam and has a reported history of brain cyst. Will start Topamax for migraine prevention and Maxalt for rescue. Counseled on limiting rescue medications to 2 days per week to avoid rebound headaches.  PLAN: -MRI brain with contrast -Prevention: Start Topamax 25 mg QHS, increase by 25 mg weekly up to 100 mg QHS -Rescue: Start Maxalt 10 mg ODT PRN, Zofran 4 mg PRN for nausea -next steps: consider Cymbalta or amitriptyline (would need to switch from Lexapro)  I spent a total of 50 minutes chart reviewing and counseling the patient. Headache education was done. Discussed treatment options including preventive and acute medications. Discussed medication overuse headache and to limit use of acute treatments to no more than 2 days/week or 10 days/month. Discussed medication side effects, adverse reactions and drug interactions. Written educational materials and patient instructions outlining all of the above were given.  Follow-up: 6 months   Genia Harold, MD 02/23/2022   12:29 PM

## 2022-02-23 ENCOUNTER — Encounter: Payer: Self-pay | Admitting: Psychiatry

## 2022-02-23 ENCOUNTER — Telehealth: Payer: Self-pay | Admitting: Psychiatry

## 2022-02-23 ENCOUNTER — Ambulatory Visit: Payer: Medicaid Other | Admitting: Psychiatry

## 2022-02-23 VITALS — BP 125/85 | HR 72 | Ht <= 58 in | Wt 125.0 lb

## 2022-02-23 DIAGNOSIS — R200 Anesthesia of skin: Secondary | ICD-10-CM

## 2022-02-23 DIAGNOSIS — G43109 Migraine with aura, not intractable, without status migrainosus: Secondary | ICD-10-CM | POA: Diagnosis not present

## 2022-02-23 DIAGNOSIS — R202 Paresthesia of skin: Secondary | ICD-10-CM | POA: Diagnosis not present

## 2022-02-23 MED ORDER — RIZATRIPTAN BENZOATE 10 MG PO TBDP: 10.0000 mg | ORAL_TABLET | ORAL | 11 refills | Status: DC | PRN

## 2022-02-23 MED ORDER — LORAZEPAM 0.5 MG PO TABS: ORAL_TABLET | ORAL | 0 refills | Status: DC

## 2022-02-23 MED ORDER — TOPIRAMATE 25 MG PO TABS: ORAL_TABLET | ORAL | 6 refills | Status: DC

## 2022-02-23 MED ORDER — ONDANSETRON HCL 4 MG PO TABS: 4.0000 mg | ORAL_TABLET | Freq: Three times a day (TID) | ORAL | 6 refills | Status: DC | PRN

## 2022-02-23 NOTE — Telephone Encounter (Signed)
At our visit today we discussed decreasing her use of caffeine pills. I'd like her to try this first and see if her sleep improves. If she does not notice improvement after a couple of months we can consider a sleep consult at that time

## 2022-02-23 NOTE — Telephone Encounter (Signed)
Contacted pt back, she and friend Shanon Brow, was objective to waiting until pt decreases her use of caffeine pills. PT friend stated regardless if she is using the pills or not she cannot sleep it has been a life long issue prior to them being homeless and was addiment that pt gets sleep study done. I reiterated to pt the effects of caffeine and the prolong use of it is not healthy and it can alter sleep.  Any other suggestion ?

## 2022-02-23 NOTE — Patient Instructions (Addendum)
Plan:  Start Topamax for headache prevention. Take 25 mg (1 pill) at bedtime for one week, then increase to 50 mg (2 pills) at bedtime for one week, then take 75 mg (3 pills) at bedtime for one week, then take 100 mg (4 pills) at bedtime  Start rizatriptan (Maxalt) as needed for migraine. Take at the onset of migraine. If headache recurs or does not fully resolve, you may take a second dose after 2 hours. Please avoid taking more than 2 days per week or 10 days per month.  Take Zofran as needed for nausea  Try limiting ibuprofen and caffeine pill use to 2 days per week  MRI brain

## 2022-02-23 NOTE — Telephone Encounter (Signed)
Pt came to check out requesting a note to be sent back asking Dr. Billey Gosling to refer her to one of our sleep providers. She states that she feels her migraines may be sleep related and would like to discuss this with one of the sleep doctors if possible. Please advise at [phone].

## 2022-02-23 NOTE — Telephone Encounter (Signed)
If she's not willing to wait then she will need to reach out to her PCP to see if they will schedule one for her. Otherwise she can wait until she's cut back on the caffeine pills

## 2022-02-23 NOTE — Telephone Encounter (Signed)
[place] medicaid NPR sent to GI 

## 2022-03-02 ENCOUNTER — Encounter (HOSPITAL_COMMUNITY): Payer: Self-pay | Admitting: Psychiatry

## 2022-03-02 ENCOUNTER — Ambulatory Visit (INDEPENDENT_AMBULATORY_CARE_PROVIDER_SITE_OTHER): Payer: Medicaid Other | Admitting: Psychiatry

## 2022-03-02 VITALS — BP 140/82 | Temp 98.6°F | Ht <= 58 in | Wt 121.0 lb

## 2022-03-02 DIAGNOSIS — F411 Generalized anxiety disorder: Secondary | ICD-10-CM | POA: Diagnosis not present

## 2022-03-02 DIAGNOSIS — F5102 Adjustment insomnia: Secondary | ICD-10-CM

## 2022-03-02 DIAGNOSIS — F331 Major depressive disorder, recurrent, moderate: Secondary | ICD-10-CM | POA: Diagnosis not present

## 2022-03-02 MED ORDER — BUPROPION HCL ER (SR) 100 MG PO TB12
100.0000 mg | ORAL_TABLET | Freq: Every day | ORAL | 1 refills | Status: DC
Start: 2022-03-02 — End: 2024-07-04

## 2022-03-03 ENCOUNTER — Institutional Professional Consult (permissible substitution): Payer: Self-pay | Admitting: Plastic Surgery

## 2022-03-03 ENCOUNTER — Ambulatory Visit (INDEPENDENT_AMBULATORY_CARE_PROVIDER_SITE_OTHER): Payer: Medicaid Other | Admitting: Plastic Surgery

## 2022-03-03 ENCOUNTER — Encounter: Payer: Self-pay | Admitting: Plastic Surgery

## 2022-03-03 VITALS — BP 138/86 | HR 84 | Ht <= 58 in | Wt 121.6 lb

## 2022-03-03 DIAGNOSIS — Z411 Encounter for cosmetic surgery: Secondary | ICD-10-CM

## 2022-03-03 NOTE — Progress Notes (Signed)
Referring Provider Rehman, Areeg N, DO [address] [place],  [place] [address]   CC:  Chief Complaint  Patient presents with   Consult      Emily Norton is an 46 y.o. female.  HPI: Patient presents to discuss breast lift.  She is bothered by how low her breast seen on her chest.  This has been a progressive problem for her and she has difficulty getting bras that fit.  She would like to be her current size if not a little larger.  She has had benign breast lesions removed in the past.  She has a number of family members who have had breast cancer.  She is up-to-date on her mammograms.  She does not smoke and is not a diabetic.  She is currently a D cup.  No Known Allergies  Outpatient Encounter Medications as of 03/03/2022  Medication Sig   buPROPion ER (WELLBUTRIN SR) 100 MG 12 hr tablet Take 1 tablet (100 mg total) by mouth daily.   escitalopram (LEXAPRO) 10 MG tablet Take 1 tablet (10 mg total) by mouth daily.   ferrous sulfate 325 (65 FE) MG EC tablet Take 1 tablet (325 mg total) by mouth daily with breakfast.   hydrOXYzine (ATARAX) 25 MG tablet Take 1 tablet (25 mg total) by mouth 3 (three) times daily as needed.   ibuprofen (ADVIL) 600 MG tablet Take 1 tablet (600 mg total) by mouth every 8 (eight) hours as needed.   LORazepam (ATIVAN) 0.5 MG tablet Take 1-2 pills 30 minutes prior to MRI   ondansetron (ZOFRAN) 4 MG tablet Take 1 tablet (4 mg total) by mouth every 8 (eight) hours as needed for nausea or vomiting.   pantoprazole (PROTONIX) 40 MG tablet Take 1 tablet (40 mg total) by mouth daily.   rizatriptan (MAXALT-MLT) 10 MG disintegrating tablet Take 1 tablet (10 mg total) by mouth as needed for migraine. May repeat in 2 hours if needed   terbinafine (LAMISIL) 250 MG tablet Take 1 tablet (250 mg total) by mouth daily.   topiramate (TOPAMAX) 25 MG tablet Take 25 mg (1 pill) at bedtime for one week, then increase to 50 mg (2 pills) at bedtime for one week, then take 75 mg  (3 pills) at bedtime for one week, then take 100 mg (4 pills) at bedtime   Facility-Administered Encounter Medications as of 03/03/2022  Medication   0.9 %  sodium chloride infusion     Past Medical History:  Diagnosis Date   Difficult airway for intubation, initial encounter 02/10/2022   per patient and states information from [place] [facility]   GERD (gastroesophageal reflux disease)    History of toe surgery    Hypokalemia 02/03/2021   Hypothyroidism 01/29/2021   Thrombocytopenia ([facility]) 04/16/2019    Past Surgical History:  Procedure Laterality Date   BREAST BIOPSY Bilateral 03/13/2021   BREAST EXCISIONAL BIOPSY Right 09/06/2012   (-)   CHOLECYSTECTOMY     [facility]  [facility] [place]   ESOPHAGOGASTRODUODENOSCOPY  2013   [facility] [place]   ESOPHAGUS SURGERY     open heart surgery  2013   thoracic surgery per pt    Family History  Problem Relation Age of Onset   Breast cancer Mother    Migraines Father    BRCA 1/2 Sister        brca+. had preventive dbl mastectomy   Heart attack Maternal Grandfather    Breast cancer Cousin  Colon cancer Neg Hx    Esophageal cancer Neg Hx     Social History   Social History Narrative   Caffeine intake- 2-3 cups once in while   Right handed     Review of Systems General: Denies fevers, chills, weight loss CV: Denies chest pain, shortness of breath, palpitations  Physical Exam    03/03/2022    4:20 PM 03/02/2022    1:57 PM 02/23/2022   11:08 AM  Vitals with BMI  Height 4' 8"  4' 8"  Weight 121 lbs 10 oz  125 lbs  BMI 81.85  90.93  Systolic 112  162  Diastolic 86  85  Pulse 84  72     Information is confidential and restricted. Go to Review Flowsheets to unlock data.    General:  No acute distress,  Alert and oriented, Non-Toxic, Normal speech and affect Breast: She has grade 3 ptosis.  Sternal notch to nipple is 26 cm on the right and 29 cm on the left.  No obvious masses.  There is a midsternal  scar from thoracic surgery.  Abdomen: Abdomen is soft nontender.  No obvious hernias.  Mild to moderate excess skin and adipose tissue.  Assessment/Plan Patient is a reasonable candidate for mastopexy.  I think this would address her goals.  We also discussed abdominal liposuction with fat transfer to improve upper pole fullness.  She is not interested in an implant and does not want to be that much larger than her current volume.  We discussed risks of the procedure that include bleeding, infection, damage to surrounding structures need for additional procedures.  We discussed changes in nipple sensation and blood supply that may result in partial loss of the areola.  We discussed the location and orientation of the scars and the anticipated recovery.  All of her questions were answered and we will plan to provide a quote for her.  Cindra Presume 03/03/2022, 5:27 PM

## 2022-03-04 ENCOUNTER — Encounter: Payer: Self-pay | Admitting: *Deleted

## 2022-03-05 ENCOUNTER — Encounter: Payer: Self-pay | Admitting: Psychiatry

## 2022-03-05 ENCOUNTER — Ambulatory Visit
Admission: RE | Admit: 2022-03-05 | Discharge: 2022-03-05 | Disposition: A | Discharge status: Home / Self Care | Payer: Medicaid Other | Source: Ambulatory Visit | Attending: Psychiatry | Admitting: Psychiatry

## 2022-03-05 DIAGNOSIS — R202 Paresthesia of skin: Secondary | ICD-10-CM | POA: Diagnosis not present

## 2022-03-05 DIAGNOSIS — R200 Anesthesia of skin: Secondary | ICD-10-CM

## 2022-03-05 MED ORDER — GADOBENATE DIMEGLUMINE 529 MG/ML IV SOLN
10.0000 mL | Freq: Once | INTRAVENOUS | Status: AC | PRN
  Administered 2022-03-05: 10 mL via INTRAVENOUS

## 2022-03-08 ENCOUNTER — Other Ambulatory Visit: Payer: Self-pay | Admitting: Internal Medicine

## 2022-03-08 NOTE — Telephone Encounter (Signed)
Contacted pt per rq via mychart, no answer.

## 2022-03-16 ENCOUNTER — Other Ambulatory Visit: Payer: Self-pay | Admitting: Psychiatry

## 2022-03-16 ENCOUNTER — Telehealth: Payer: Self-pay | Admitting: *Deleted

## 2022-03-16 ENCOUNTER — Encounter: Payer: Self-pay | Admitting: Psychiatry

## 2022-03-16 ENCOUNTER — Telehealth: Payer: Self-pay | Admitting: Psychiatry

## 2022-03-16 MED ORDER — UBRELVY 100 MG PO TABS: 100.0000 mg | ORAL_TABLET | ORAL | 6 refills | Status: DC | PRN

## 2022-03-16 MED ORDER — METHYLPREDNISOLONE 4 MG PO TBPK: ORAL_TABLET | ORAL | 0 refills | Status: DC

## 2022-03-16 NOTE — Telephone Encounter (Signed)
[place] [employer] PA signed and faxed to [facility] [employer], received confirmation.

## 2022-03-16 NOTE — Telephone Encounter (Addendum)
Called patient, phone was on speaker and boy friend spoke as well. Patient immediately asked to speak to MD. I advised MD is with patients this afternoon.  I need to gather more information before  sending to MD.  Patient has just started 100 mg dose of topamax, stated she has a "high tolerance to medicines" but it is not helping. Rizatriptan didn't help much when she took it. She occasionally has nausea, light headedness and per boyfriend "is on the mat".  She stated she has 2 cysts on both sides of head , knows that may be causing headaches.  They are currently in [place] on vacation but he stated if new Rx is sent it can go to [facility] in [place]; they will get it sent to them via his parents.   They asked if she should go somewhere to get " a shot". I advised that if she has a migraine headache that she cannot get rid of or tolerate going to Urgent Care for treatment is a good option, especially since they are away from home.  I advised will send to Dr Billey Gosling and they will get call back today. They verbalized understanding, appreciation.

## 2022-03-16 NOTE — Telephone Encounter (Signed)
Called patient and informed her of Dr Georgina Peer reply and 2 new Rxs. Answered questions to her stated satisfaction. Patient verbalized understanding, appreciation.

## 2022-03-16 NOTE — Telephone Encounter (Signed)
I attempted to reach the pt, but phone would ring, answer, and then loose connection. This happened twice.  I will send a mychart message to the pt.

## 2022-03-16 NOTE — Telephone Encounter (Signed)
Pt is calling because her headaches have gotten worse and the medication is not working. Pt would like for the doctor to call her to discuss this issue.

## 2022-03-16 NOTE — Telephone Encounter (Signed)
Topamax can take 4-6 weeks at the 100 mg dose before it starts to take effect so she will need to give it more time before we can determine if it will work or not. I will send in an rx for Ubrelvy to her pharmacy to see if that works better than Maxalt. It may take a while to get approval, so in the meantime I'll send a short course of steroids to her pharmacy for her current headache.

## 2022-03-16 NOTE — Telephone Encounter (Signed)
Emily Norton Medicaid PA for for Emily Norton filled out, on MD desk for review, signature.

## 2022-03-17 ENCOUNTER — Encounter: Payer: Self-pay | Admitting: *Deleted

## 2022-03-17 NOTE — Telephone Encounter (Signed)
Emily Norton approved x 1 year. Notified patient via my chart.

## 2022-04-05 ENCOUNTER — Telehealth: Payer: Self-pay | Admitting: Gastroenterology

## 2022-04-05 NOTE — Telephone Encounter (Signed)
Tried to return call and line rings and  no voice mail has been set up.

## 2022-04-05 NOTE — Telephone Encounter (Signed)
Patient's friend, Lorna Few, called regarding patient's upcoming hospital procedure.  Because of schedule issues, he is asking if it can be put off for about a week or so.  The other issue is that patient's Medicaid will probably run out at the end of August so it would need to be scheduled before then.  He said if nothing else is available, they may have to keep the appointment already scheduled.  Please call patient's boyfriend and advise.  Thank you.

## 2022-04-06 NOTE — Telephone Encounter (Unsigned)
Unable to reach pt friend: Voice mailbox full. Unable to leave message  Unable to reach pt: Left message for pt to call back:

## 2022-04-07 ENCOUNTER — Other Ambulatory Visit: Payer: Self-pay | Admitting: Internal Medicine

## 2022-04-08 ENCOUNTER — Other Ambulatory Visit: Payer: Self-pay

## 2022-04-08 ENCOUNTER — Encounter (HOSPITAL_COMMUNITY): Payer: Self-pay | Admitting: Gastroenterology

## 2022-04-08 DIAGNOSIS — Z1211 Encounter for screening for malignant neoplasm of colon: Secondary | ICD-10-CM

## 2022-04-08 DIAGNOSIS — K219 Gastro-esophageal reflux disease without esophagitis: Secondary | ICD-10-CM

## 2022-04-08 DIAGNOSIS — D509 Iron deficiency anemia, unspecified: Secondary | ICD-10-CM

## 2022-04-08 MED ORDER — NA SULFATE-K SULFATE-MG SULF 17.5-3.13-1.6 GM/177ML PO SOLN
1.0000 | Freq: Once | ORAL | 0 refills | Status: AC
Start: 2022-04-08 — End: 2022-04-08

## 2022-04-08 NOTE — Telephone Encounter (Signed)
Spoke with pt and pt's significant other. Pt stated she needed prep and instructions for EGD/colon procedure on 8/10 at [facility]. Suprep sent to pt's pharmacy and instructions sent to [facility] and mailed.  Ambulatory referral placed. Secure staff message sent to pre-cert.

## 2022-04-09 ENCOUNTER — Telehealth: Payer: Self-pay

## 2022-04-09 NOTE — Telephone Encounter (Signed)
Called and spoke with patient to confirm that she is till following with our office for GI care. She will be at her appt on 04/15/22 for EGD/colon with Dr. Bryan Lemma.

## 2022-04-13 ENCOUNTER — Ambulatory Visit (HOSPITAL_COMMUNITY): Payer: Self-pay | Admitting: Psychiatry

## 2022-04-13 NOTE — Telephone Encounter (Signed)
Pt requested to cancel hospital procedure on 8/10 and wanted to reschedule procedure in 2 weeks. Let pt know that we do not have anything available within the next 2 weeks and the next available hospital procedure would be in November. Pt stated she did not want to cancel procedure and that she would be at procedure on 8/10.

## 2022-04-13 NOTE — Telephone Encounter (Signed)
Patient is calling to reschedule her hospital colonoscopy because she is out of town and is trying to get back. She needs a call back within an hour.

## 2022-04-14 NOTE — Anesthesia Preprocedure Evaluation (Signed)
Anesthesia Evaluation  Patient identified by MRN, date of birth, ID band Patient awake    Reviewed: Allergy & Precautions, NPO status , Patient's Chart, lab work & pertinent test results  History of Anesthesia Complications (+) DIFFICULT AIRWAY and history of anesthetic complications (per patient report, no records in EMR)  Airway Mallampati: III  TM Distance: >3 FB Neck ROM: Full    Dental no notable dental hx.    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 This presumably is secondary  to repair of a symptomatic vascular ring in the past. No acute findings in the chest.     Neuro/Psych  Headaches, Anxiety Depression    GI/Hepatic Neg liver ROS, GERD  Medicated and Controlled,  Endo/Other  Hypothyroidism   Renal/GU negative Renal ROS  negative genitourinary   Musculoskeletal negative musculoskeletal ROS (+)   Abdominal   Peds  Hematology negative hematology ROS (+)   Anesthesia Other Findings Day of surgery medications reviewed with patient.  Reproductive/Obstetrics negative OB ROS                            Anesthesia Physical Anesthesia Plan  ASA: 3  Anesthesia Plan: MAC   Post-op Pain Management: Minimal or no pain anticipated   Induction:   PONV Risk Score and Plan: 2 and Treatment may vary due to age or medical condition and Propofol infusion  Airway Management Planned: Natural Airway and Nasal Cannula  Additional Equipment: None  Intra-op Plan:   Post-operative Plan:   Informed Consent: I have reviewed the patients History and  Physical, chart, labs and discussed the procedure including the risks, benefits and alternatives for the proposed anesthesia with the patient or authorized representative who has indicated his/her understanding and acceptance.       Plan Discussed with: CRNA  Anesthesia Plan Comments:        Anesthesia Quick Evaluation

## 2022-04-15 ENCOUNTER — Ambulatory Visit (HOSPITAL_COMMUNITY)
Admission: RE | Admit: 2022-04-15 | Discharge: 2022-04-15 | Disposition: A | Discharge status: Home / Self Care | Payer: Medicaid Other | Source: Ambulatory Visit | Attending: Gastroenterology | Admitting: Gastroenterology

## 2022-04-15 ENCOUNTER — Encounter (HOSPITAL_COMMUNITY): Payer: Self-pay | Admitting: Gastroenterology

## 2022-04-15 ENCOUNTER — Ambulatory Visit (HOSPITAL_COMMUNITY): Payer: Medicaid Other | Admitting: Certified Registered Nurse Anesthetist

## 2022-04-15 ENCOUNTER — Encounter (HOSPITAL_COMMUNITY)
Admission: RE | Disposition: A | Discharge status: Home / Self Care | Payer: Self-pay | Source: Ambulatory Visit | Attending: Gastroenterology

## 2022-04-15 ENCOUNTER — Ambulatory Visit (HOSPITAL_BASED_OUTPATIENT_CLINIC_OR_DEPARTMENT_OTHER): Payer: Medicaid Other | Admitting: Certified Registered Nurse Anesthetist

## 2022-04-15 ENCOUNTER — Other Ambulatory Visit: Payer: Self-pay

## 2022-04-15 DIAGNOSIS — K2100 Gastro-esophageal reflux disease with esophagitis, without bleeding: Secondary | ICD-10-CM

## 2022-04-15 DIAGNOSIS — K2101 Gastro-esophageal reflux disease with esophagitis, with bleeding: Secondary | ICD-10-CM | POA: Diagnosis not present

## 2022-04-15 DIAGNOSIS — K311 Adult hypertrophic pyloric stenosis: Secondary | ICD-10-CM | POA: Diagnosis not present

## 2022-04-15 DIAGNOSIS — K635 Polyp of colon: Secondary | ICD-10-CM | POA: Diagnosis not present

## 2022-04-15 DIAGNOSIS — Z79899 Other long term (current) drug therapy: Secondary | ICD-10-CM | POA: Insufficient documentation

## 2022-04-15 DIAGNOSIS — K2950 Unspecified chronic gastritis without bleeding: Secondary | ICD-10-CM | POA: Diagnosis not present

## 2022-04-15 DIAGNOSIS — Q400 Congenital hypertrophic pyloric stenosis: Secondary | ICD-10-CM

## 2022-04-15 DIAGNOSIS — D122 Benign neoplasm of ascending colon: Secondary | ICD-10-CM | POA: Insufficient documentation

## 2022-04-15 DIAGNOSIS — K2210 Ulcer of esophagus without bleeding: Secondary | ICD-10-CM | POA: Diagnosis not present

## 2022-04-15 DIAGNOSIS — K3189 Other diseases of stomach and duodenum: Secondary | ICD-10-CM | POA: Insufficient documentation

## 2022-04-15 DIAGNOSIS — E039 Hypothyroidism, unspecified: Secondary | ICD-10-CM | POA: Insufficient documentation

## 2022-04-15 DIAGNOSIS — D509 Iron deficiency anemia, unspecified: Secondary | ICD-10-CM | POA: Insufficient documentation

## 2022-04-15 DIAGNOSIS — K219 Gastro-esophageal reflux disease without esophagitis: Secondary | ICD-10-CM

## 2022-04-15 HISTORY — PX: ESOPHAGOGASTRODUODENOSCOPY (EGD) WITH PROPOFOL: SHX5813

## 2022-04-15 HISTORY — PX: POLYPECTOMY: SHX5525

## 2022-04-15 HISTORY — PX: BALLOON DILATION: SHX5330

## 2022-04-15 HISTORY — PX: BIOPSY: SHX5522

## 2022-04-15 HISTORY — PX: COLONOSCOPY WITH PROPOFOL: SHX5780

## 2022-04-15 SURGERY — ESOPHAGOGASTRODUODENOSCOPY (EGD) WITH PROPOFOL
Anesthesia: Monitor Anesthesia Care

## 2022-04-15 MED ORDER — PHENYLEPHRINE HCL (PRESSORS) 10 MG/ML IV SOLN
INTRAVENOUS | Status: AC
  Filled 2022-04-15: qty 1

## 2022-04-15 MED ORDER — SODIUM CHLORIDE 0.9 % IV SOLN: INTRAVENOUS | Status: DC

## 2022-04-15 MED ORDER — ONDANSETRON HCL 4 MG/2ML IJ SOLN
INTRAMUSCULAR | Status: DC | PRN
  Administered 2022-04-15: 4 mg via INTRAVENOUS

## 2022-04-15 MED ORDER — SUCRALFATE 1 G PO TABS: 1.0000 g | ORAL_TABLET | Freq: Four times a day (QID) | ORAL | 1 refills | Status: DC

## 2022-04-15 MED ORDER — PROPOFOL 10 MG/ML IV BOLUS
INTRAVENOUS | Status: AC
  Filled 2022-04-15: qty 20

## 2022-04-15 MED ORDER — LACTATED RINGERS IV SOLN: INTRAVENOUS | Status: DC

## 2022-04-15 MED ORDER — ONDANSETRON HCL 4 MG/2ML IJ SOLN
INTRAMUSCULAR | Status: AC
  Filled 2022-04-15: qty 2

## 2022-04-15 MED ORDER — PROPOFOL 10 MG/ML IV BOLUS
INTRAVENOUS | Status: DC | PRN
  Administered 2022-04-15 (×2): 20 mg via INTRAVENOUS

## 2022-04-15 MED ORDER — PROPOFOL 1000 MG/100ML IV EMUL
INTRAVENOUS | Status: AC
  Filled 2022-04-15: qty 100

## 2022-04-15 MED ORDER — LIDOCAINE 2% (20 MG/ML) 5 ML SYRINGE
INTRAMUSCULAR | Status: DC | PRN
  Administered 2022-04-15: 50 mg via INTRAVENOUS

## 2022-04-15 MED ORDER — DEXAMETHASONE SODIUM PHOSPHATE 10 MG/ML IJ SOLN
INTRAMUSCULAR | Status: AC
  Filled 2022-04-15: qty 1

## 2022-04-15 MED ORDER — PROPOFOL 500 MG/50ML IV EMUL
INTRAVENOUS | Status: DC | PRN
  Administered 2022-04-15: 125 ug/kg/min via INTRAVENOUS

## 2022-04-15 MED ORDER — PANTOPRAZOLE SODIUM 40 MG PO TBEC: 40.0000 mg | DELAYED_RELEASE_TABLET | Freq: Two times a day (BID) | ORAL | 0 refills | Status: DC

## 2022-04-15 SURGICAL SUPPLY — 25 items

## 2022-04-15 NOTE — Anesthesia Procedure Notes (Signed)
Procedure Name: MAC Date/Time: 04/15/2022 7:28 AM  Performed by: West Pugh, CRNAPre-anesthesia Checklist: Patient identified, Emergency Drugs available, Suction available, Patient being monitored and Timeout performed Patient Re-evaluated:Patient Re-evaluated prior to induction Oxygen Delivery Method: Simple face mask Preoxygenation: Pre-oxygenation with 100% oxygen Placement Confirmation: positive ETCO2 Comments: pom

## 2022-04-15 NOTE — H&P (Signed)
GASTROENTEROLOGY PROCEDURE H&P NOTE   Primary Care Physician: Linward Natal, MD    Reason for Procedure:  Iron deficiency anemia, GERD, dysphagia  Plan:    EGD, colonoscopy  Patient is appropriate for endoscopic procedure(s) at [facility]  The nature of the procedure, as well as the risks, benefits, and alternatives were carefully and thoroughly reviewed with the patient. Ample time for discussion and questions allowed. The patient understood, was satisfied, and agreed to proceed.     HPI: Emily Norton is a 46 y.o. female who presents for EGD and colonoscopy for evaluation of iron deficiency anemia, GERD, dysphagia, and ongoing colon cancer screening.  Due to reported history of difficult intubation, procedure scheduled at [facility]oscopy unit.  Past Medical History:  Diagnosis Date   Difficult airway for intubation, initial encounter 02/10/2022   per patient and states information from [place] hospital   GERD (gastroesophageal reflux disease)    History of toe surgery    Hypokalemia 02/03/2021   Hypothyroidism 01/29/2021   Thrombocytopenia ([place]) 04/16/2019    Past Surgical History:  Procedure Laterality Date   BREAST BIOPSY Bilateral 03/13/2021   BREAST EXCISIONAL BIOPSY Right 09/06/2012   (-)   CHOLECYSTECTOMY     COLONOSCOPY  2013   [facility]   ESOPHAGOGASTRODUODENOSCOPY  2013   [facility] [place]   ESOPHAGUS SURGERY     open heart surgery  2013   thoracic surgery per pt    Prior to Admission medications   Medication Sig Start Date End Date Taking? Authorizing Provider  ibuprofen (ADVIL) 600 MG tablet Take 1 tablet (600 mg total) by mouth every 8 (eight) hours as needed. Patient taking differently: Take 600 mg by mouth every 8 (eight) hours as needed for mild pain, moderate pain or headache. 09/24/21  Yes Edrick Kins, DPM  methylPREDNISolone (MEDROL DOSEPAK) 4 MG TBPK tablet Take as directed by  packaging Patient taking differently: Take 4 mg by mouth daily as needed (Migraines). Take as directed by packaging 03/16/22  Yes Genia Harold, MD  naproxen sodium (ALEVE) 220 MG tablet Take 220 mg by mouth daily as needed (Migraine).   Yes [provider]  pantoprazole (PROTONIX) 40 MG tablet Take 1 tablet by mouth once daily 04/07/22  Yes Farrel Gordon, DO  buPROPion ER [facility] SR) 100 MG 12 hr tablet Take 1 tablet (100 mg total) by mouth daily. 03/02/22 03/02/23  Merian Capron, MD  escitalopram (LEXAPRO) 10 MG tablet Take 1 tablet (10 mg total) by mouth daily. Patient not taking: Reported on 04/12/2022 12/31/21 12/31/22  Atway, Jeananne Rama, DO  ferrous sulfate 325 (65 FE) MG EC tablet Take 1 tablet (325 mg total) by mouth daily with breakfast. Patient not taking: Reported on 04/12/2022 01/01/22 04/01/22  Atway, Rayann N, DO  ondansetron (ZOFRAN) 4 MG tablet Take 1 tablet (4 mg total) by mouth every 8 (eight) hours as needed for nausea or vomiting. 02/23/22   Genia Harold, MD  Ubrogepant (UBRELVY) 100 MG TABS Take 100 mg by mouth as needed (for migraine). May repeat a dose in 2 hours if headache persists. Max dose 2 pills in 24 hours 03/16/22   Genia Harold, MD    Current Facility-Administered Medications  Medication Dose Route Frequency Provider Last Rate Last Admin   0.9 %  sodium chloride infusion   Intravenous Continuous Coolidge Gossard V, DO       lactated ringers infusion   Intravenous Continuous  Blanche Scovell V, DO 10 mL/hr at 04/15/22 0710 New Bag at 04/15/22 0710    Allergies as of 02/11/2022   (No Known Allergies)    Family History  Problem Relation Age of Onset   Breast cancer Mother    Migraines Father    BRCA 1/2 Sister        brca+. had preventive dbl mastectomy   Heart attack Maternal Grandfather    Breast cancer Cousin    Colon cancer Neg Hx    Esophageal cancer Neg Hx     Social History   Socioeconomic History   Marital status: Significant Other     Spouse name: Not on file   Number of children: Not on file   Years of education: Not on file   Highest education level: Not on file  Occupational History   Not on file  Tobacco Use   Smoking status: Never   Smokeless tobacco: Never  Vaping Use   Vaping Use: Never used  Substance and Sexual Activity   Alcohol use: Yes    Comment: ocassional   Drug use: Not Currently   Sexual activity: Yes  Other Topics Concern   Not on file  Social History Narrative   Caffeine intake- 2-3 cups once in while   Right handed   Social Determinants of Health   Financial Resource Strain: Not on file  Food Insecurity: Not on file  Transportation Needs: Not on file  Physical Activity: Not on file  Stress: Not on file  Social Connections: Not on file  Intimate Partner Violence: Not on file    Physical Exam: Vital signs in last 24 hours: _0  139/89   Pulse 65   Temp 98.7 F (37.1 C) (Temporal)   Resp 13   Ht _1  (1.422 m)   Wt 54.9 kg   LMP 04/05/2022 (Approximate) Comment: Anesthesiologist okay without pregnancy test  SpO2 100%   BMI 27.13 kg/m  GEN: NAD EYE: Sclerae anicteric ENT: MMM CV: Non-tachycardic Pulm: CTA b/l GI: Soft, NT/ND NEURO:  Alert & Oriented x 3   Gerrit Heck, DO [facility]   04/15/2022 7:20 AM

## 2022-04-15 NOTE — Interval H&P Note (Signed)
History and Physical Interval Note:  04/15/2022 7:25 AM  Emily Norton  has presented today for surgery, with the diagnosis of IDA and GERD.  The various methods of treatment have been discussed with the patient and family. After consideration of risks, benefits and other options for treatment, the patient has consented to  Procedure(s): ESOPHAGOGASTRODUODENOSCOPY (EGD) WITH PROPOFOL (N/A) COLONOSCOPY WITH PROPOFOL (N/A) as a surgical intervention.  The patient's history has been reviewed, patient examined, no change in status, stable for surgery.  I have reviewed the patient's chart and labs.  Questions were answered to the patient's satisfaction.     Dominic Pea Lord Lancour

## 2022-04-15 NOTE — Anesthesia Postprocedure Evaluation (Signed)
Anesthesia Post Note  Patient: Emily Norton  Procedure(s) Performed: ESOPHAGOGASTRODUODENOSCOPY (EGD) WITH PROPOFOL COLONOSCOPY WITH PROPOFOL BIOPSY BALLOON DILATION POLYPECTOMY     Patient location during evaluation: PACU Anesthesia Type: MAC Level of consciousness: awake and alert Pain management: pain level controlled Vital Signs Assessment: post-procedure vital signs reviewed and stable Respiratory status: spontaneous breathing, nonlabored ventilation and respiratory function stable Cardiovascular status: blood pressure returned to baseline Postop Assessment: no apparent nausea or vomiting Anesthetic complications: no   No notable events documented.  Last Vitals:  Vitals:   04/15/22 0850 04/15/22 0900  BP: 95/66 122/64  Pulse: 68 (!) 54  Resp: 16 20  Temp:    SpO2: 98% 100%    Last Pain:  Vitals:   04/15/22 0900  TempSrc:   PainSc: 0-No pain                 Marthenia Rolling

## 2022-04-15 NOTE — Discharge Instructions (Signed)
YOU HAD AN ENDOSCOPIC PROCEDURE TODAY: Refer to the procedure report and other information in the discharge instructions given to you for any specific questions about what was found during the examination. If this information does not answer your questions, please call [place] office at [phone] to clarify.  ° °YOU SHOULD EXPECT: Some feelings of bloating in the abdomen. Passage of more gas than usual. Walking can help get rid of the air that was put into your GI tract during the procedure and reduce the bloating. If you had a lower endoscopy (such as a colonoscopy or flexible sigmoidoscopy) you may notice spotting of blood in your stool or on the toilet paper. Some abdominal soreness may be present for a day or two, also. ° °DIET: Your first meal following the procedure should be a light meal and then it is ok to progress to your normal diet. A half-sandwich or bowl of soup is an example of a good first meal. Heavy or fried foods are harder to digest and may make you feel nauseous or bloated. Drink plenty of fluids but you should avoid alcoholic beverages for 24 hours. If you had a esophageal dilation, please see attached instructions for diet.   ° °ACTIVITY: Your care partner should take you home directly after the procedure. You should plan to take it easy, moving slowly for the rest of the day. You can resume normal activity the day after the procedure however YOU SHOULD NOT DRIVE, use power tools, machinery or perform tasks that involve climbing or major physical exertion for 24 hours (because of the sedation medicines used during the test).  ° °SYMPTOMS TO REPORT IMMEDIATELY: °A gastroenterologist can be reached at any hour. Please call [phone]  for any of the following symptoms:  °Following lower endoscopy (colonoscopy, flexible sigmoidoscopy) °Excessive amounts of blood in the stool  °Significant tenderness, worsening of abdominal pains  °Swelling of the abdomen that is new, acute  °Fever of 100° or  higher  °Following upper endoscopy (EGD, EUS, ERCP, esophageal dilation) °Vomiting of blood or coffee ground material  °New, significant abdominal pain  °New, significant chest pain or pain under the shoulder blades  °Painful or persistently difficult swallowing  °New shortness of breath  °Black, tarry-looking or red, bloody stools ° °FOLLOW UP:  °If any biopsies were taken you will be contacted by phone or by letter within the next 1-3 weeks. Call [phone]  if you have not heard about the biopsies in 3 weeks.  °Please also call with any specific questions about appointments or follow up tests. ° °

## 2022-04-15 NOTE — Transfer of Care (Signed)
Immediate Anesthesia Transfer of Care Note  Patient: Emily Norton  Procedure(s) Performed: ESOPHAGOGASTRODUODENOSCOPY (EGD) WITH PROPOFOL COLONOSCOPY WITH PROPOFOL BIOPSY BALLOON DILATION POLYPECTOMY  Patient Location: PACU and Endoscopy Unit  Anesthesia Type:MAC  Level of Consciousness: drowsy  Airway & Oxygen Therapy: Patient Spontanous Breathing and Patient connected to face mask oxygen  Post-op Assessment: Report given to RN and Post -op Vital signs reviewed and stable  Post vital signs: Reviewed and stable  Last Vitals:  Vitals Value Taken Time  BP 116/61 04/15/22 0838  Temp    Pulse 50 04/15/22 0839  Resp 14 04/15/22 0839  SpO2 100 % 04/15/22 0839  Vitals shown include unvalidated device data.  Last Pain:  Vitals:   04/15/22 0657  TempSrc: Temporal  PainSc: 0-No pain         Complications: No notable events documented.

## 2022-04-15 NOTE — Op Note (Signed)
[facility] Patient Name: Emily Norton Procedure Date: 04/15/2022 MRN: [id number] Attending MD: Gerrit Heck , MD Date of Birth: [birth year] CSN: [id number] Age: 46 Admit Type: Outpatient Procedure:                Upper GI endoscopy Indications:              Iron deficiency anemia, Dysphagia, Suspected                            esophageal reflux Providers:                Gerrit Heck, MD, Burtis Junes, RN, Darliss Cheney,                            Technician Referring MD:              Medicines:                Monitored Anesthesia Care Complications:            No immediate complications. Estimated Blood Loss:     Estimated blood loss was minimal. Procedure:                Pre-Anesthesia Assessment:                           - Prior to the procedure, a History and Physical                            was performed, and patient medications and                            allergies were reviewed. The patient's tolerance of                            previous anesthesia was also reviewed. The risks                            and benefits of the procedure and the sedation                            options and risks were discussed with the patient.                            All questions were answered, and informed consent                            was obtained. Prior Anticoagulants: The patient has                            taken no previous anticoagulant or antiplatelet                            agents. ASA Grade Assessment: III - A patient with                            severe systemic  disease. After reviewing the risks                            and benefits, the patient was deemed in                            satisfactory condition to undergo the procedure.                           After obtaining informed consent, the endoscope was                            passed under direct vision. Throughout the                            procedure, the patient's blood  pressure, pulse, and                            oxygen saturations were monitored continuously. The                            [id number]-H190 ([id number]) Olympus endoscope was introduced                            through the mouth, and advanced to the pylorus,                            which could not be traversed due to severe                            stenosis. The upper GI endoscopy was technically                            difficult and complex due to stenosis. Successful                            completion of the procedure was aided by                            withdrawing the scope and replacing with the                            'babyscope'. The patient tolerated the procedure                            well. Scope In: Scope Out: Findings:      LA Grade D (one or more mucosal breaks involving at least 75% of       esophageal circumference) esophagitis with bleeding was found in the       lower third of the esophagus. Biopsies were taken with a cold forceps       for histology. Estimated blood loss was minimal.      The gastric fundus, gastric body, incisura and gastric antrum were       normal.  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       This was non-traversable with the standard gastroscope. The scope was       withdrawn and replaced with the 'babyscope' which was advanced into the       duodenum. A Hydrajag wire was placed into the duodenum in preparation       for pyloric stenosis dilation. The babyscope was removed and wire       remained in place. The gastroscope was then backloaded onto the wire and       advanced into the stomach. A 4 mm x 2 cm Hurricane balloon dilator was       passed through the scope. Dilation was performed. The dilation site was       examined and showed mild improvement in luminal narrowing, but still       unable to traverse with the standard endoscope. Biopsies were taken with       a cold forceps for  histology. Estimated blood loss was minimal.      The duodenal bulb, first portion of the duodenum and second portion of       the duodenum were normal. Impression:               - LA Grade D reflux esophagitis with bleeding.                            Biopsied.                           - Normal gastric fundus, gastric body, incisura and                            antrum.                           - Gastric stenosis was found at the pylorus.                            Dilated. Biopsied.                           - Normal duodenal bulb, first portion of the                            duodenum and second portion of the duodenum. Moderate Sedation:      Not Applicable - Patient had care per Anesthesia. Recommendation:           - Patient has a contact number available for                            emergencies. The signs and symptoms of potential                            delayed complications were discussed with the                            patient. Return to normal activities tomorrow.  Written discharge instructions were provided to the                            patient.                           - Soft diet today then advance to previous diet                            tomorrow.                           - Use Protonix (pantoprazole) 40 mg PO BID for 8                            weeks, then reduce to 40 mg daily.                           - Repeat upper endoscopy in 8 weeks to check                            healing. Plan to perform at [facility] Endoscopy                            unit with fluoroscopy available.                           - Perform a colonoscopy today.                           - Await pathology results. Procedure Code(s):        --- Professional ---                           [phone], Esophagogastroduodenoscopy, flexible,                            transoral; with dilation of gastric/duodenal                            stricture(s)  (eg, balloon, bougie)                           53664, 67, Esophagogastroduodenoscopy, flexible,                            transoral; with biopsy, single or multiple Diagnosis Code(s):        --- Professional ---                           K21.01, Gastro-esophageal reflux disease with                            esophagitis, with bleeding                           K31.1, Adult hypertrophic  pyloric stenosis                           D50.9, Iron deficiency anemia, unspecified                           R13.10, Dysphagia, unspecified CPT copyright 2019 American Medical Association. All rights reserved. The codes documented in this report are preliminary and upon coder review may  be revised to meet current compliance requirements. Gerrit Heck, MD 04/15/2022 8:49:00 AM Number of Addenda: 0

## 2022-04-15 NOTE — Op Note (Signed)
[facility] Patient Name: Emily Norton Procedure Date: 04/15/2022 MRN: [id number] Attending MD: Gerrit Heck , MD Date of Birth: 02-13-1976 CSN: [id number] Age: 46 Admit Type: Outpatient Procedure:                Colonoscopy Indications:              Iron deficiency anemia Providers:                Gerrit Heck, MD, Burtis Junes, RN, Darliss Cheney,                            Technician Referring MD:              Medicines:                Monitored Anesthesia Care Complications:            No immediate complications. Estimated Blood Loss:     Estimated blood loss was minimal. Procedure:                Pre-Anesthesia Assessment:                           - Prior to the procedure, a History and Physical                            was performed, and patient medications and                            allergies were reviewed. The patient's tolerance of                            previous anesthesia was also reviewed. The risks                            and benefits of the procedure and the sedation                            options and risks were discussed with the patient.                            All questions were answered, and informed consent                            was obtained. Prior Anticoagulants: The patient has                            taken no previous anticoagulant or antiplatelet                            agents. ASA Grade Assessment: III - A patient with                            severe systemic disease. After reviewing the risks                            and  benefits, the patient was deemed in                            satisfactory condition to undergo the procedure.                           After obtaining informed consent, the colonoscope                            was passed under direct vision. Throughout the                            procedure, the patient's blood pressure, pulse, and                            oxygen saturations were  monitored continuously. The                            [id number]-HQ190L ([id number]) Olympus colonoscope was                            introduced through the anus and advanced to the the                            terminal ileum. The colonoscopy was performed                            without difficulty. The patient tolerated the                            procedure well. The quality of the bowel                            preparation was good. The terminal ileum, ileocecal                            valve, appendiceal orifice, and rectum were                            photographed. Scope In: 8:16:10 AM Scope Out: 8:31:36 AM Scope Withdrawal Time: 0 hours 10 minutes 37 seconds  Total Procedure Duration: 0 hours 15 minutes 26 seconds  Findings:      The perianal and digital rectal examinations were normal.      A 4 mm polyp was found in the ascending colon. The polyp was sessile.       The polyp was removed with a cold snare. Resection and retrieval were       complete. Estimated blood loss was minimal.      The exam was otherwise normal throughout the remainder of the colon.      Retroflexion in the rectum was not performed due to anatomy (narrow       rectal vault)      The terminal ileum appeared normal. Impression:               - One 4 mm polyp in the ascending  colon, removed                            with a cold snare. Resected and retrieved.                           - The examined portion of the ileum was normal. Moderate Sedation:      Not Applicable - Patient had care per Anesthesia. Recommendation:           - Patient has a contact number available for                            emergencies. The signs and symptoms of potential                            delayed complications were discussed with the                            patient. Return to normal activities tomorrow.                            Written discharge instructions were provided to the                             patient.                           - Await pathology results.                           - Repeat colonoscopy for surveillance based on                            pathology results.                           - Return to GI office. Procedure Code(s):        --- Professional ---                           [phone], Colonoscopy, flexible; with removal of                            tumor(s), polyp(s), or other lesion(s) by snare                            technique Diagnosis Code(s):        --- Professional ---                           K63.5, Polyp of colon                           D50.9, Iron deficiency anemia, unspecified CPT copyright 2019 American Medical Association. All rights reserved. The codes documented in this report are preliminary and upon coder review may  be revised to meet current compliance requirements.  Gerrit Heck, MD 04/15/2022 8:59:33 AM Number of Addenda: 0

## 2022-04-16 LAB — SURGICAL PATHOLOGY

## 2022-04-19 ENCOUNTER — Other Ambulatory Visit: Payer: Self-pay

## 2022-04-19 ENCOUNTER — Other Ambulatory Visit (HOSPITAL_COMMUNITY): Payer: Self-pay

## 2022-04-19 ENCOUNTER — Telehealth: Payer: Self-pay | Admitting: Pharmacy Technician

## 2022-04-19 MED ORDER — PROTONIX 40 MG PO TBEC: 40.0000 mg | DELAYED_RELEASE_TABLET | Freq: Two times a day (BID) | ORAL | 0 refills | Status: DC

## 2022-04-19 NOTE — Telephone Encounter (Signed)
Patient Advocate Encounter  Received notification from [employer] that prior authorization for PANTOPRAZOLE '40MG'$  is required.   PA submitted on 8.14.23 NCTRACKS [id number] Viona Gilmore PA# [id number] Status is pending    Luciano Cutter, CPhT Patient Advocate Phone: [phone]

## 2022-04-19 NOTE — Telephone Encounter (Signed)
Sent in the Protonix DAW 1 as [employer] instructed to have it covered.

## 2022-05-05 ENCOUNTER — Other Ambulatory Visit: Payer: Self-pay

## 2022-05-05 ENCOUNTER — Telehealth: Payer: Self-pay | Admitting: Gastroenterology

## 2022-05-05 DIAGNOSIS — K219 Gastro-esophageal reflux disease without esophagitis: Secondary | ICD-10-CM

## 2022-05-05 DIAGNOSIS — K2210 Ulcer of esophagus without bleeding: Secondary | ICD-10-CM

## 2022-05-05 NOTE — Telephone Encounter (Signed)
Pt is requesting a call back to discuss results from last visit. Thank you

## 2022-05-05 NOTE — Telephone Encounter (Signed)
Spoke with pt and pt's boyfriend. Pt stated she received a message on mychart but wasn't sure what the message was. Gave pt pathology results for colonoscopy and EGD. Pt's boyfriend stated that pt's medicaid runs out tomorrow and he thought that if pt was scheduled for procedure today medicaid would cover procedure on next available hospital day, November 13th. Pt's boyfriend instructed me to schedule procedure, check on medicaid coverage, and call them back tomorrow. Let pt and pt's boyfriend know they can call us back when pt has insurance again but pt did not want to do that. Verified with [employer] about coverage of procedure. [employer] stated that if coverage expires tomorrow procedure will not be covered in November and pt would be self-pay. Called pt back to let her know. Pt requested that I schedule procedure for November 13th and she would find insurance coverage before procedure. Orders placed for EGD at [facility] hospital.

## 2022-05-06 NOTE — Telephone Encounter (Signed)
Pt scheduled for EGD on 07/19/22 at 8:45 am at [place] scheduled for 07/05/22 at 4:30 pm. Pt verbalized understanding and had no other concerns at end of call.

## 2022-06-22 ENCOUNTER — Telehealth: Payer: Self-pay | Admitting: *Deleted

## 2022-06-22 NOTE — Telephone Encounter (Signed)
Emily Norton,   This pt is a documented difficult intubation and her procedure will need to be done at the hospital.    Thanks,  Royer Cristobal 

## 2022-06-25 NOTE — Telephone Encounter (Signed)
Colonoscopy has been scheduled at the hospital for 07-19-2022.

## 2022-07-05 ENCOUNTER — Telehealth: Payer: Self-pay | Admitting: *Deleted

## 2022-07-05 NOTE — Telephone Encounter (Signed)
Uanble to reach patient for phone pre visit. Left message on non-identifying home phone, unable to leave message on cell phone.

## 2022-07-06 NOTE — Telephone Encounter (Signed)
Hi Nabina This is a hospital patient and was unable to reach her for pre visit.  Before I cancel , do you want to attempt to reach out and get her rescheduled for a PV? Otherwise I will have to cancel her.

## 2022-07-06 NOTE — Telephone Encounter (Signed)
Unable to reach her. I left her VM to return the call.

## 2022-07-06 NOTE — Telephone Encounter (Signed)
Patient did not answer phone and has not returned call from  yesterday advising she needed to reschedule  pre visit. Missed appoint letter and [facility] appt cancelled per [facility] CMA

## 2022-07-15 NOTE — Telephone Encounter (Signed)
Patient called to reschedule her hospital procedure and also requested a pre visit currently on for 08/02/22.

## 2022-07-16 NOTE — Telephone Encounter (Signed)
Called patient back.Spoke with Shanon Brow to schedule hospital procedure on 09/09/21 with Dr. Bryan Lemma. Stated she may not have insurance in January 24 and would like to schedule sooner possible. Informed him that Dr. Bryan Lemma doesn't have any openings in December. He requested to keep pre-visit appointment for now hoping that there might be cancellation in December. Informed him that we may need to cancel her appointment if there is no opening by then. He verbalized understanding and no other concerns at the end of the call.

## 2022-07-19 ENCOUNTER — Encounter (HOSPITAL_COMMUNITY): Payer: Self-pay

## 2022-07-19 ENCOUNTER — Ambulatory Visit (HOSPITAL_COMMUNITY): Admit: 2022-07-19 | Payer: Medicaid Other | Admitting: Gastroenterology

## 2022-07-19 SURGERY — ESOPHAGOGASTRODUODENOSCOPY (EGD) WITH PROPOFOL
Anesthesia: Monitor Anesthesia Care

## 2022-07-23 ENCOUNTER — Telehealth: Payer: Self-pay | Admitting: *Deleted

## 2022-07-23 NOTE — Telephone Encounter (Signed)
Emily Norton,   This pt is a documented difficult intubation and her procedure will need to be done at the hospital.   Thanks,  Osvaldo Angst

## 2022-07-26 ENCOUNTER — Telehealth: Payer: Self-pay | Admitting: *Deleted

## 2022-07-26 NOTE — Telephone Encounter (Signed)
Nabina, I see this patient back on schedule for a pre visit but don't see an appointment scheduled at the hosptial.

## 2022-07-26 NOTE — Telephone Encounter (Signed)
Emily Norton, Patient friend Shanon Brow called back. Let him know that Dr. Bryan Lemma didn't have any hospital cancellation on 08/18/22. Cancelled pre-visit appointment scheduled for 08/02/22. Shanon Brow stated he will call back in January to schedule pre-visit.. See telephone notes dated on 07/05/22 for details.

## 2022-08-09 IMAGING — MG MM BREAST BX W/ LOC DEV 1ST LESION IMAGE BX SPEC STEREO GUIDE*R*
7 of 13 series · 7 of 37 positions shown · non-contrast
Comparison: Previous exams.
COMPARISON: Previous exams.

Addendum:
CLINICAL DATA: Biopsy of calcifications in the upper outer right
breast and the lower outer left breast.

EXAM:
BILATERAL BREAST STEREOTACTIC CORE NEEDLE BIOPSIES

[R (1 of 6)]
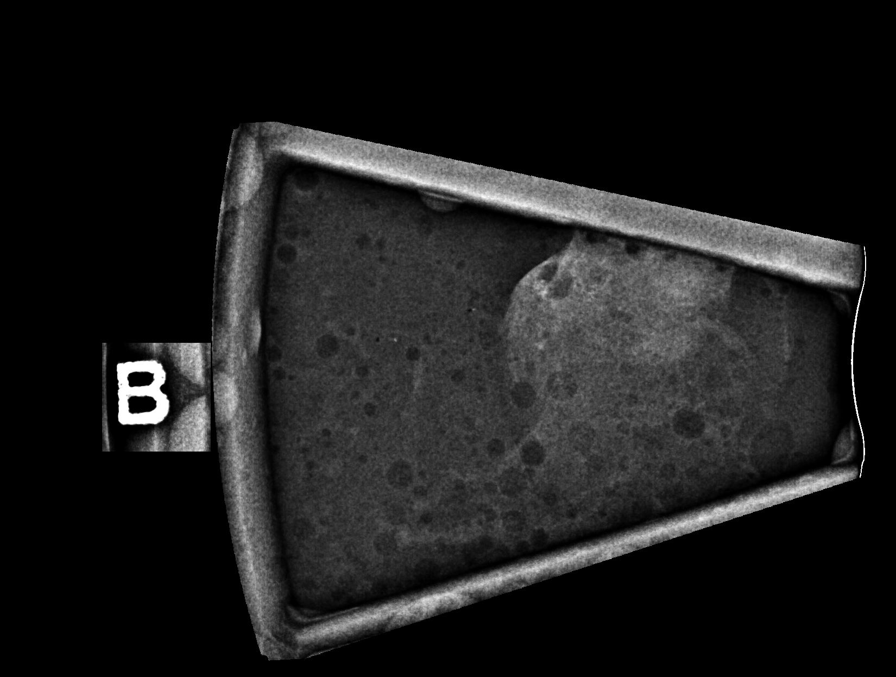

[R (2 of 6)]
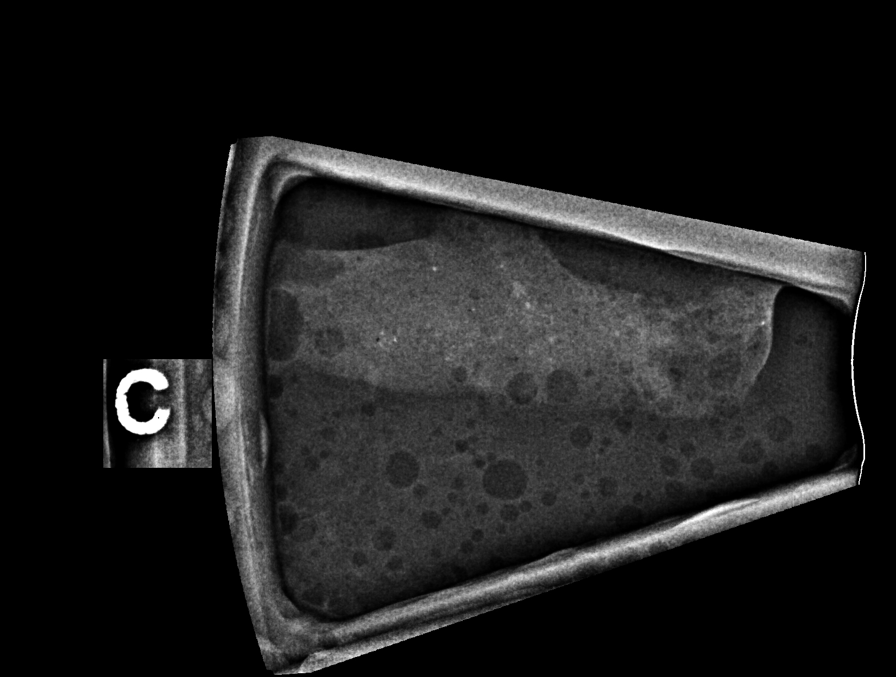

[R (3 of 6)]
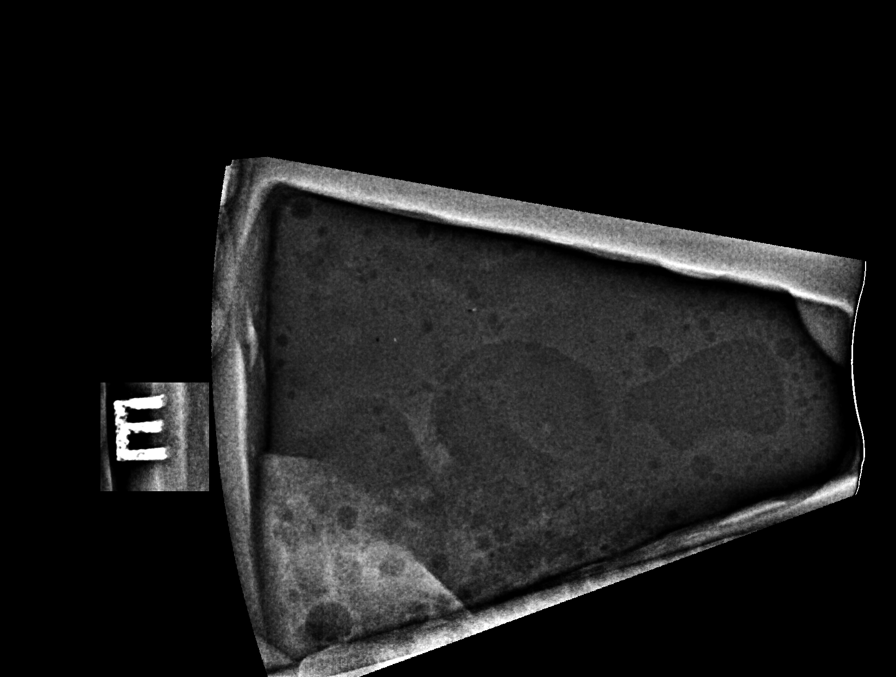

[R (4 of 6)]
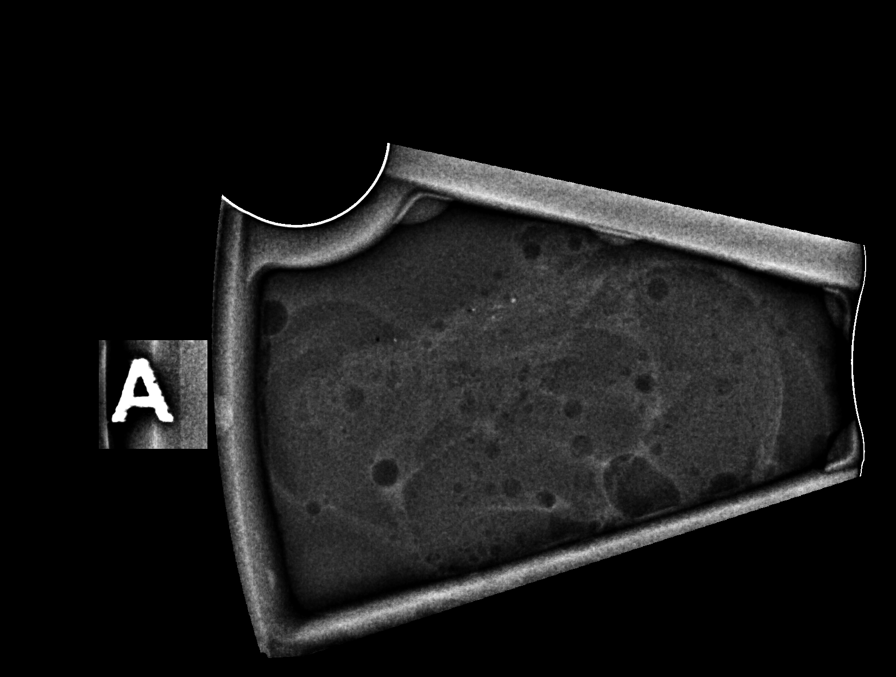

[R (5 of 6)]
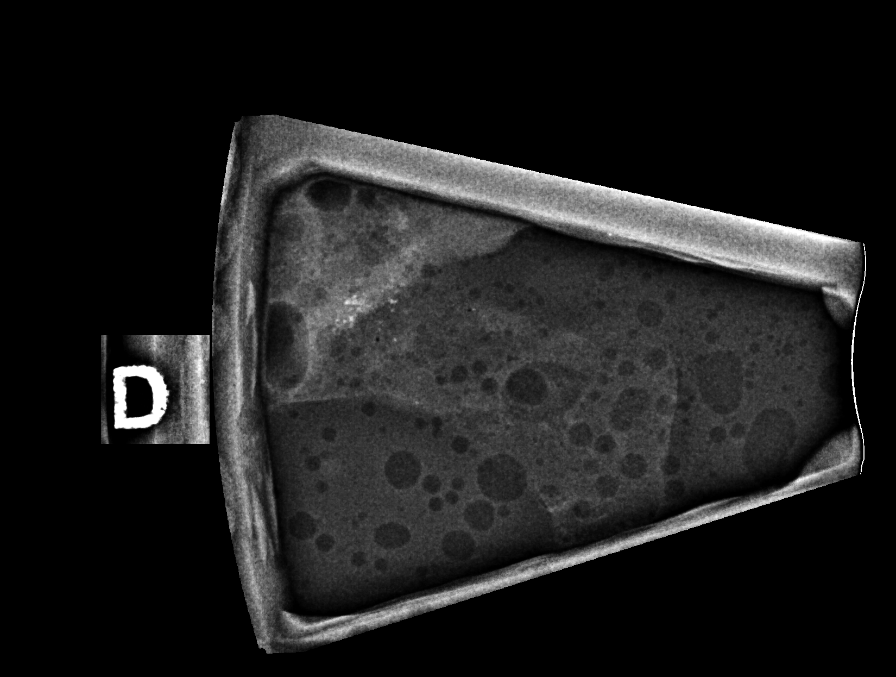

[R (6 of 6)]
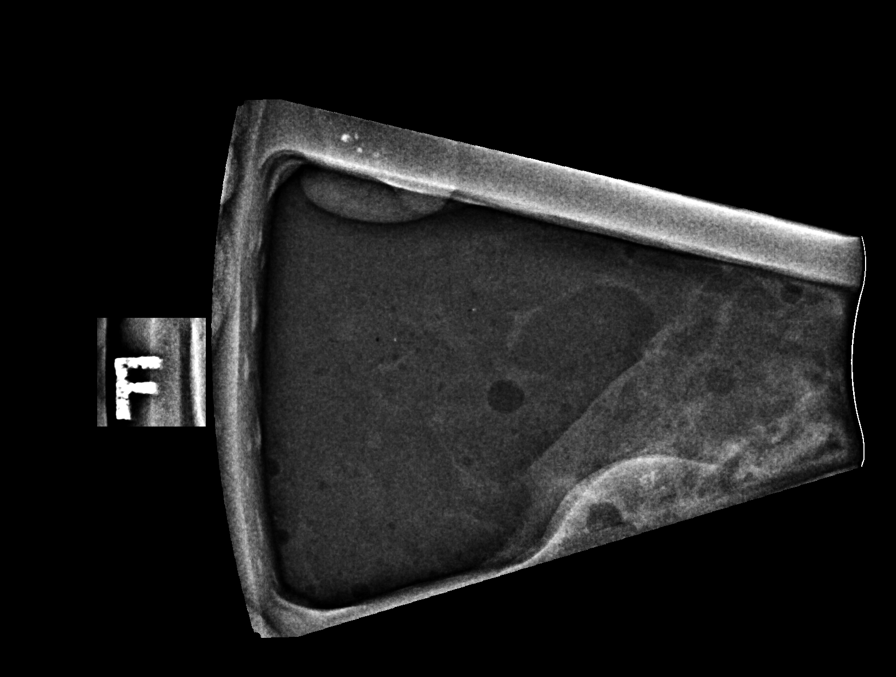

[R LM]
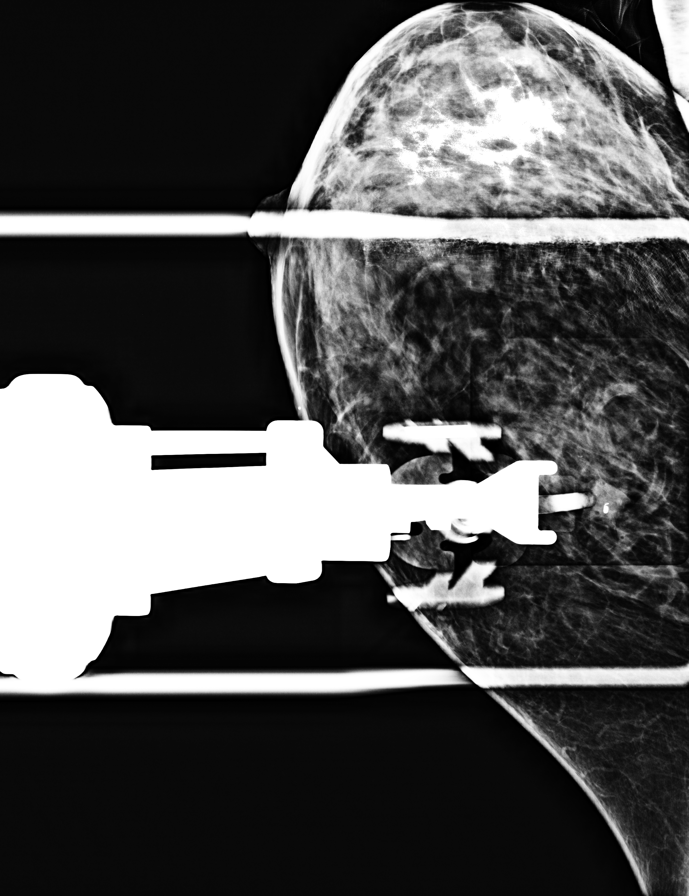

[7 of 37 positions shown; findings below may reference images not displayed]



Using sterile technique and 1% Lidocaine as local anesthetic, under
stereotactic guidance, a 9 gauge vacuum assisted device was used to
perform core needle biopsy of calcifications in the upper outer
right breast using a lateral approach. Specimen radiograph was
performed showing calcifications in several core specimens.
Specimens with calcifications are identified for pathology.

Lesion quadrant: Upper outer right breast

At the conclusion of the procedure, a coil shaped tissue marker clip
was deployed into the biopsy cavity. Follow-up 2-view mammogram was
performed and dictated separately.

Using sterile technique and 1% Lidocaine as local anesthetic, under
stereotactic guidance, a 9 gauge vacuum assisted device was used to
perform core needle biopsy of calcifications in the lower outer left
breast using a lateral approach. Specimen radiograph was performed
showing calcifications in several core specimens. Specimens with
calcifications are identified for pathology.

Lesion quadrant: Lower outer left breast

At the conclusion of the procedure, a ribbon shaped tissue marker
clip was deployed into the biopsy cavity. Follow-up 2-view mammogram
was performed and dictated separately.
IMPRESSION: Stereotactic-guided biopsy of bilateral breast calcifications. No
apparent complications.

ADDENDUM:
Pathology revealed FIBROCYSTIC CHANGE WITH CALCIFICATIONS of the
RIGHT breast, upper outer, (coil clip). This was found to be
concordant by Dr. Smindlo Dk.

Pathology revealed FIBROCYSTIC CHANGE WITH CALCIFICATIONS of the
LEFT breast, lower outer, (ribbon clip). This was found to be
concordant by Dr. Smindlo Dk.

Pathology results were discussed with the patient by telephone. The
patient reported doing well after the biopsies with tenderness at
the sites, more on the LEFT side. Post biopsy instructions and care
were reviewed and questions were answered. The patient was
encouraged to call The [REDACTED] for any
additional concerns. My direct phone number was provided.

The patient was asked to return for BILATERAL diagnostic mammography
and LEFT breast ultrasound in 6 months and informed a reminder
notice would be sent regarding this appointment.

Pathology results reported by Laaouina Tiger, RN on 04/03/2021.



Using sterile technique and 1% Lidocaine as local anesthetic, under
stereotactic guidance, a 9 gauge vacuum assisted device was used to
perform core needle biopsy of calcifications in the upper outer
right breast using a lateral approach. Specimen radiograph was
performed showing calcifications in several core specimens.
Specimens with calcifications are identified for pathology.

Lesion quadrant: Upper outer right breast

At the conclusion of the procedure, a coil shaped tissue marker clip
was deployed into the biopsy cavity. Follow-up 2-view mammogram was
performed and dictated separately.

Using sterile technique and 1% Lidocaine as local anesthetic, under
stereotactic guidance, a 9 gauge vacuum assisted device was used to
perform core needle biopsy of calcifications in the lower outer left
breast using a lateral approach. Specimen radiograph was performed
showing calcifications in several core specimens. Specimens with
calcifications are identified for pathology.

Lesion quadrant: Lower outer left breast

At the conclusion of the procedure, a ribbon shaped tissue marker
clip was deployed into the biopsy cavity. Follow-up 2-view mammogram
was performed and dictated separately.
IMPRESSION: Stereotactic-guided biopsy of bilateral breast calcifications. No
apparent complications.

## 2022-08-09 IMAGING — MG MM BREAST BX W LOC DEV 1ST LESION IMAGE BX SPEC STEREO GUIDE*L*
7 series · 7 of 31 positions shown · non-contrast
Comparison: Previous exams.
COMPARISON: Previous exams.

Addendum:
CLINICAL DATA: Biopsy of calcifications in the upper outer right
breast and the lower outer left breast.

EXAM:
BILATERAL BREAST STEREOTACTIC CORE NEEDLE BIOPSIES

[L LM]
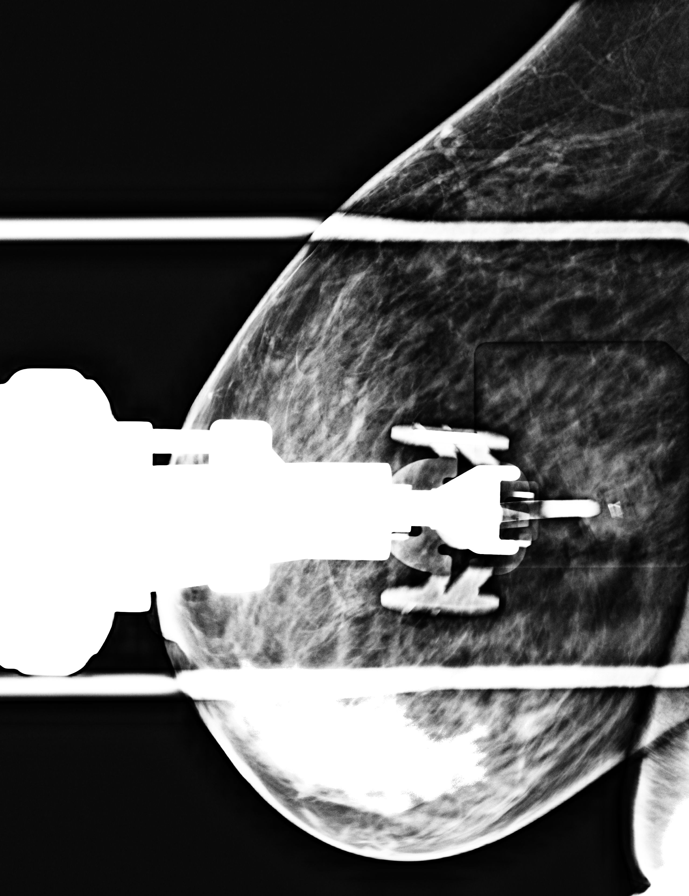

[L LM tomo (1 of 6) · tomo slice 16/31.0]
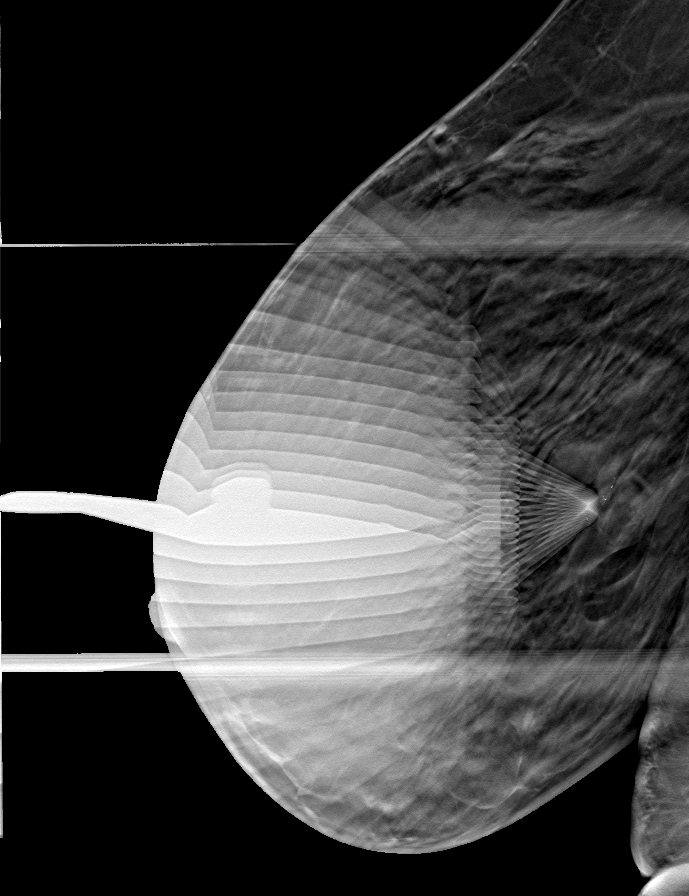

[L LM tomo (2 of 6) · tomo slice 19/36.0]
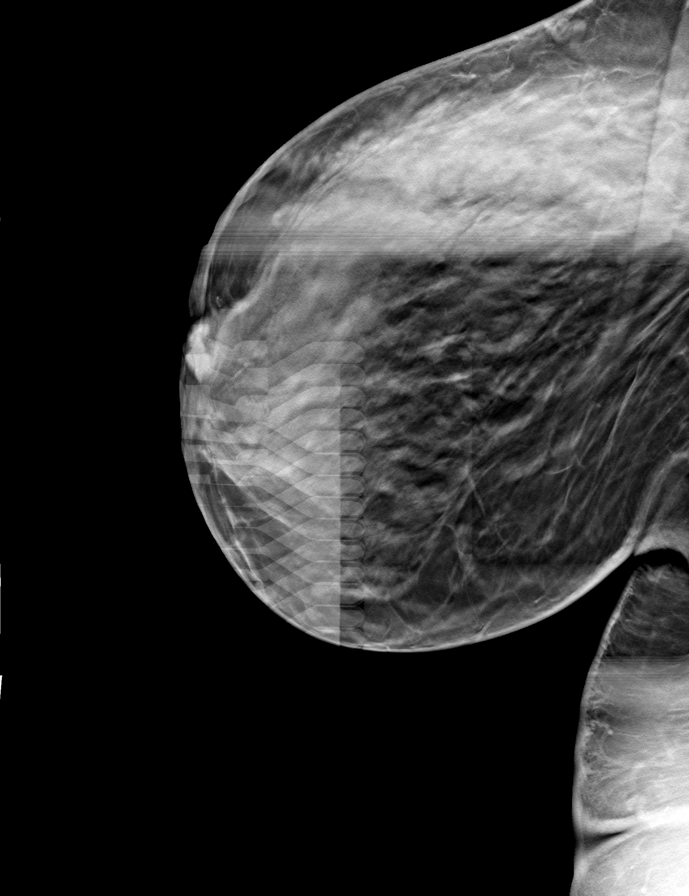

[L LM tomo (3 of 6) · tomo slice 16/31.0]
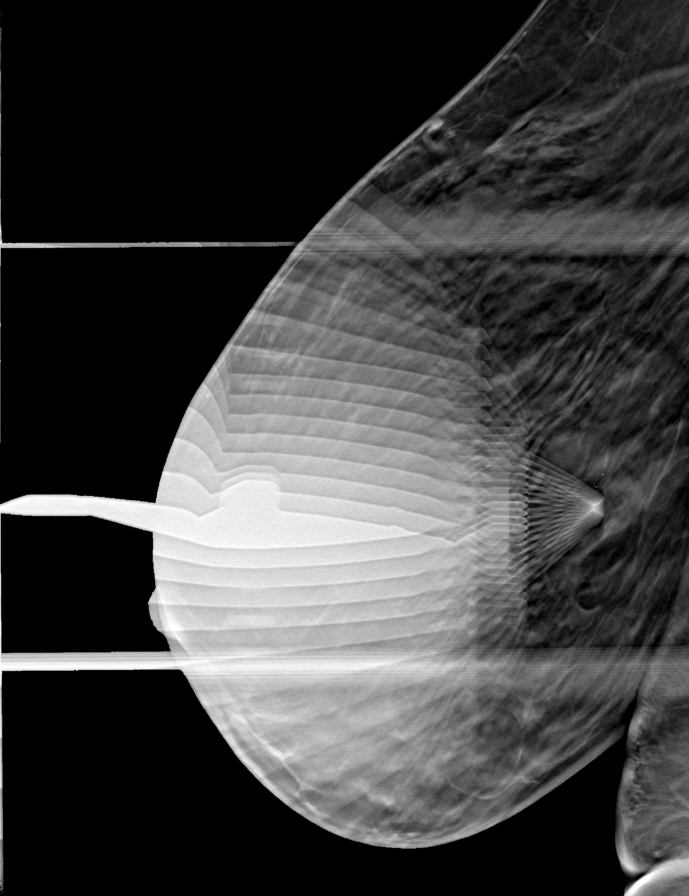

[L LM tomo (4 of 6) · tomo slice 16/31.0]
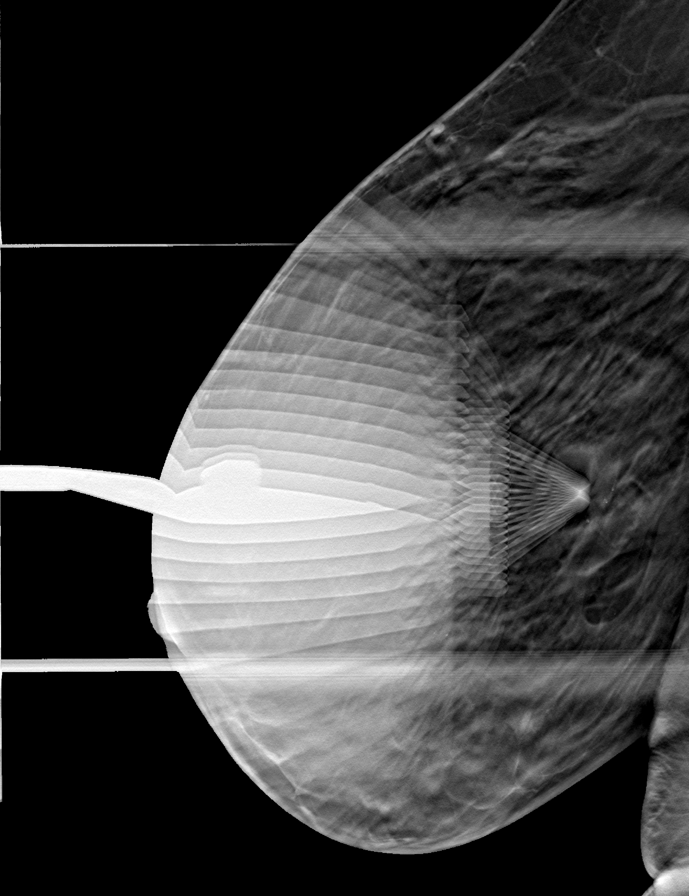

[L LM tomo (5 of 6) · tomo slice 17/32.0]
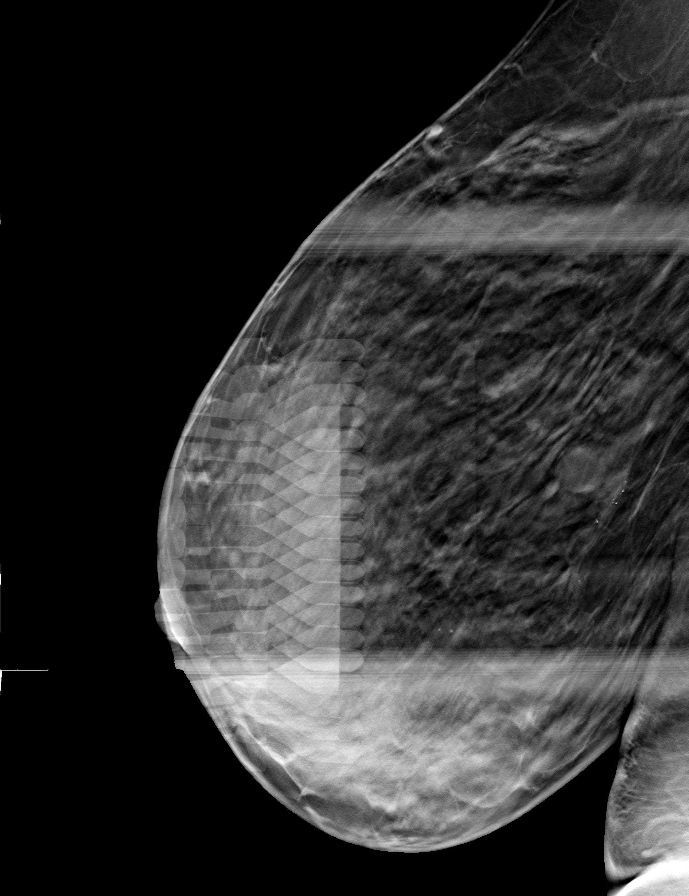

[L LM tomo (6 of 6) · tomo slice 17/33.0]
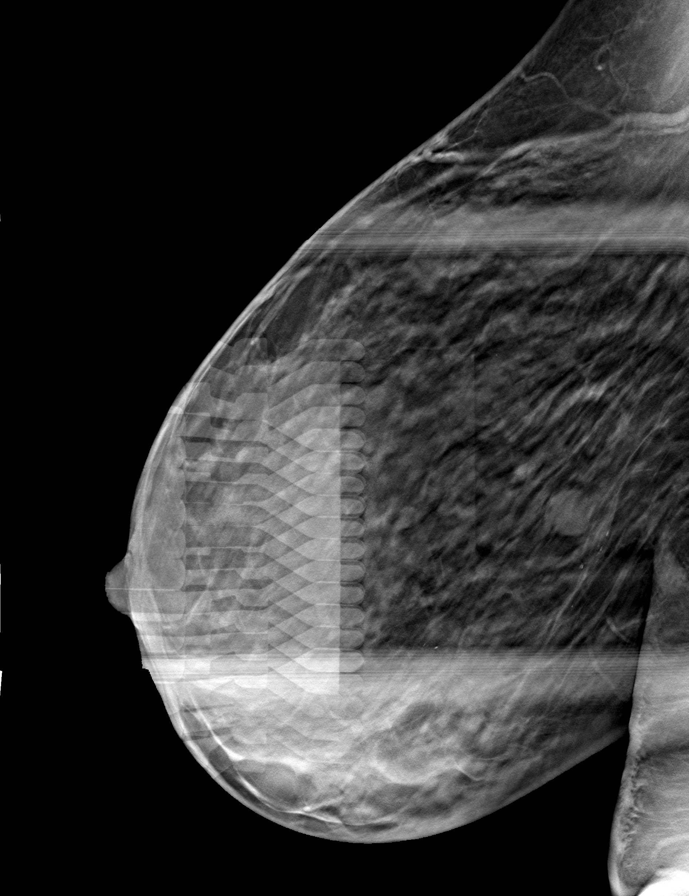

[7 of 31 positions shown; findings below may reference images not displayed]



Using sterile technique and 1% Lidocaine as local anesthetic, under
stereotactic guidance, a 9 gauge vacuum assisted device was used to
perform core needle biopsy of calcifications in the upper outer
right breast using a lateral approach. Specimen radiograph was
performed showing calcifications in several core specimens.
Specimens with calcifications are identified for pathology.

Lesion quadrant: Upper outer right breast

At the conclusion of the procedure, a coil shaped tissue marker clip
was deployed into the biopsy cavity. Follow-up 2-view mammogram was
performed and dictated separately.

Using sterile technique and 1% Lidocaine as local anesthetic, under
stereotactic guidance, a 9 gauge vacuum assisted device was used to
perform core needle biopsy of calcifications in the lower outer left
breast using a lateral approach. Specimen radiograph was performed
showing calcifications in several core specimens. Specimens with
calcifications are identified for pathology.

Lesion quadrant: Lower outer left breast

At the conclusion of the procedure, a ribbon shaped tissue marker
clip was deployed into the biopsy cavity. Follow-up 2-view mammogram
was performed and dictated separately.
IMPRESSION: Stereotactic-guided biopsy of bilateral breast calcifications. No
apparent complications.

ADDENDUM:
Pathology revealed FIBROCYSTIC CHANGE WITH CALCIFICATIONS of the
RIGHT breast, upper outer, (coil clip). This was found to be
concordant by Dr. Smindlo Dk.

Pathology revealed FIBROCYSTIC CHANGE WITH CALCIFICATIONS of the
LEFT breast, lower outer, (ribbon clip). This was found to be
concordant by Dr. Smindlo Dk.

Pathology results were discussed with the patient by telephone. The
patient reported doing well after the biopsies with tenderness at
the sites, more on the LEFT side. Post biopsy instructions and care
were reviewed and questions were answered. The patient was
encouraged to call The [REDACTED] for any
additional concerns. My direct phone number was provided.

The patient was asked to return for BILATERAL diagnostic mammography
and LEFT breast ultrasound in 6 months and informed a reminder
notice would be sent regarding this appointment.

Pathology results reported by Laaouina Tiger, RN on 04/03/2021.



Using sterile technique and 1% Lidocaine as local anesthetic, under
stereotactic guidance, a 9 gauge vacuum assisted device was used to
perform core needle biopsy of calcifications in the upper outer
right breast using a lateral approach. Specimen radiograph was
performed showing calcifications in several core specimens.
Specimens with calcifications are identified for pathology.

Lesion quadrant: Upper outer right breast

At the conclusion of the procedure, a coil shaped tissue marker clip
was deployed into the biopsy cavity. Follow-up 2-view mammogram was
performed and dictated separately.

Using sterile technique and 1% Lidocaine as local anesthetic, under
stereotactic guidance, a 9 gauge vacuum assisted device was used to
perform core needle biopsy of calcifications in the lower outer left
breast using a lateral approach. Specimen radiograph was performed
showing calcifications in several core specimens. Specimens with
calcifications are identified for pathology.

Lesion quadrant: Lower outer left breast

At the conclusion of the procedure, a ribbon shaped tissue marker
clip was deployed into the biopsy cavity. Follow-up 2-view mammogram
was performed and dictated separately.
IMPRESSION: Stereotactic-guided biopsy of bilateral breast calcifications. No
apparent complications.

## 2022-08-31 ENCOUNTER — Ambulatory Visit: Payer: Self-pay | Admitting: Psychiatry

## 2022-09-07 ENCOUNTER — Encounter: Payer: Self-pay | Admitting: Psychiatry

## 2022-09-07 ENCOUNTER — Ambulatory Visit: Payer: Self-pay | Admitting: Psychiatry

## 2022-09-07 NOTE — Progress Notes (Deleted)
   CC:  headaches  Follow-up Visit  Last visit: 02/23/22  Brief HPI: 47 year old female with a history of depression, GAD, migraines, hypothyroidism who follows in clinic for migraines.  At her last visit, brain MRI was ordered. She was started on Topamax for prevention and Maxalt for rescue.  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   Headache days per month: *** Migraine days per month*** Headache free days per month: ***  Current Headache Regimen: Preventative: *** Abortive: ***   Prior Therapies                                  Imitrex 100 mg PRN Maxalt 10 mg PRN - lack of efficacy Nurtec 75 mg PRN Ubrelvy 100 mg PRN Lexapro 10 mg  daily    Physical Exam:   Vital Signs: There were no vitals taken for this visit. GENERAL:  well appearing, in no acute distress, alert  SKIN:  Color, texture, turgor normal. No rashes or lesions HEAD:  Normocephalic/atraumatic. RESP: normal respiratory effort MSK:  No gross joint deformities.   NEUROLOGICAL: Mental Status: Alert, oriented to person, place and time, Follows commands, and Speech fluent and appropriate. Cranial Nerves: PERRL, face symmetric, no dysarthria, hearing grossly intact Motor: moves all extremities equally Gait: normal-based.  IMPRESSION: ***  PLAN: ***   Follow-up: ***  I spent a total of *** minutes on the date of the service. Headache education was done. Discussed lifestyle modification including increased oral hydration, decreased caffeine, exercise and stress management. Discussed treatment options including preventive and acute medications, natural supplements, and infusion therapy. Discussed medication overuse headache and to limit use of acute treatments to no more than  2 days/week or 10 days/month. Discussed medication side effects, adverse reactions and drug interactions. Written educational materials and patient instructions outlining all of the above were given.  Genia Harold, MD

## 2023-01-24 ENCOUNTER — Other Ambulatory Visit: Payer: Self-pay

## 2023-01-24 DIAGNOSIS — Z1239 Encounter for other screening for malignant neoplasm of breast: Secondary | ICD-10-CM

## 2023-02-03 ENCOUNTER — Ambulatory Visit: Payer: Self-pay

## 2023-03-08 ENCOUNTER — Ambulatory Visit: Payer: Self-pay

## 2023-03-09 ENCOUNTER — Ambulatory Visit: Payer: Self-pay

## 2023-03-14 ENCOUNTER — Ambulatory Visit: Payer: Self-pay

## 2024-06-27 ENCOUNTER — Ambulatory Visit (INDEPENDENT_AMBULATORY_CARE_PROVIDER_SITE_OTHER): Payer: MEDICAID

## 2024-06-27 ENCOUNTER — Ambulatory Visit: Payer: MEDICAID | Admitting: Podiatry

## 2024-06-27 ENCOUNTER — Encounter: Payer: Self-pay | Admitting: Podiatry

## 2024-06-27 VITALS — Ht <= 58 in | Wt 121.0 lb

## 2024-06-27 DIAGNOSIS — M7752 Other enthesopathy of left foot: Secondary | ICD-10-CM | POA: Diagnosis not present

## 2024-06-27 DIAGNOSIS — M2011 Hallux valgus (acquired), right foot: Secondary | ICD-10-CM | POA: Diagnosis not present

## 2024-06-27 DIAGNOSIS — M7751 Other enthesopathy of right foot: Secondary | ICD-10-CM

## 2024-06-27 NOTE — Progress Notes (Signed)
 Subjective:  48 year old female presenting today for follow-up evaluation after right foot surgery.  Presenting today with her husband who is very vocal and seems to lean/control the conversation on behalf of his spouse. History of bunion and hammertoe repair to the lesser digits right foot on 2 separate surgeries. H/o bunionectomy with osteotomy right. DOS: 09/24/2021 and h/o hammertoe repair 3, 4, 5 RT foot.  DOS: 03/19/2021.  Patient states that she continues to have pain and tenderness and rubbing to the toes when walking.  It is very aggravating.  Past Medical History:  Diagnosis Date   Difficult airway for intubation, initial encounter 02/10/2022   per patient and states information from [place]  hospital   GERD (gastroesophageal reflux disease)    History of toe surgery    Hypokalemia 02/03/2021   Hypothyroidism 01/29/2021   Thrombocytopenia 04/16/2019    RT foot 06/27/2024  Objective/Physical Exam General: The patient is alert and oriented x3 in no acute distress.  Dermatology: No open wounds or lesions noted.  Hyperkeratotic dystrophic nails noted to specifically digits 2, 3 of the foot possibly contributing to the pain that she is experiencing.  They are tender to touch  Vascular: Palpable pedal pulses bilaterally. No edema or erythema noted. Capillary refill within normal limits.  Neurological: Grossly intact via light touch  Musculoskeletal Exam: With weightbearing to the foot it does not appear that the toes seem to be drastically rubbing or irritating.  There are no callus lesions or skin evidence of chronic irritation and rubbing with exception of some slight erythema to the distal tip of the second toe likely secondary to the hyperkeratotic elongated fungal nail. There is contracture of the third toe at the level of the DIPJ  Radiographic Exam RT foot 06/27/2024:  Prior history of bunion surgery with routine healing.  Orthopedic hardware is intact to the first  metatarsal.  Arthrodesis noted to the PIPJ of the 3rd and 4th digits  Assessment: 1. H/o bunionectomy with osteotomy right. DOS: 09/24/2021 2. H/o hammertoe repair 3, 4, 5 RT foot.  DOS: 03/19/2021    Plan of Care:  -Patient was evaluated. X-rays reviewed -The husband led the conversation the majority of the visit and spoke on the patient's behalf.  Basically he is unsatisfied and states that the toes are not straight and continue to rub.  She does state that she has rubbing associated to the toes although there is no callus or specific areas of sensitivity.  Patient seems to relate irritation and pain diffusely throughout the digits 1-3 of the foot. -Explained to the patient and her husband that I do not believe additional surgery would correct for or alleviate any of the rubbing to the foot.  I also explained that possibly the hyperkeratotic elongated toenails are causing increased pain and discomfort to the toes.  She declined debridement today. -For now recommend conservative treatment including appropriate fitting shoes that are wide and do not constrict or irritate the toebox area -Patient and husband both request a second opinion from a different provider here in our office.  I stated that they are more than welcome to have a second opinion by a provider here in our office.  They will schedule that appointment at checkout -Return to clinic with me PRN  Thresa EMERSON Sar, DPM [facility]  Dr. Thresa EMERSON Sar, DPM    [address]  [place], [place] [address]                Office [phone]  Fax [phone]

## 2024-07-02 ENCOUNTER — Ambulatory Visit: Payer: Self-pay

## 2024-07-02 NOTE — Telephone Encounter (Signed)
 FYI Only or Action Required?: FYI only for provider.  Patient was last seen in primary care on 12/31/2021 by Atway, Rayann N, DO.  Called Nurse Triage reporting Migraine.  Symptoms began yesterday.  Interventions attempted: OTC medications: Tylenol .  Symptoms are: stable.  Triage Disposition: No disposition on file.  Patient/caregiver understands and will follow disposition?:    Copied from CRM [id number]. Topic: Clinical - Red Word Triage >> Jul 02, 2024  8:51 AM Jasmin G wrote: Red Word that prompted transfer to Nurse Triage: Pt initially called to establish care with Dr. Dottie if possible due to experiencing chronic migraines and trouble sleeping, she also has a cyst on her head that has been there for years. Answer Assessment - Initial Assessment Questions Patient's boyfriend called and she gave permission to speak to him, They both are saying she has chronic migraines that affects vision, floaters when have migraines. She denies migraines at this time, saying they just want to establish with an experienced provider.She says she took Sumatriptan  for headaches over the summer in July prescribed by someone she saw. She and her boyfriend asked if there is someone who has 30+ years of experience, a medical doctor, that she can see due to all she has going on with her current conditions. Advised I'm unable to determine how long a provider has been practicing, but assured all the providers of [place] would be able to care for her. Advised they could review the [facility] website to look at the providers profile and make a determination based on that then call us  back to schedule. They both said they will do that. They hung up the phone without me being able to provide care advice or disposition.  1. LOCATION: Where does it hurt?      Side of the temples   2. ONSET: When did the headache start? (e.g., minutes, hours, days)      Yesterday  3. PATTERN: Does the pain come and go, or  has it been constant since it started?     Comes and goes, not present at this time   5. RECURRENT SYMPTOM: Have you ever had headaches before? If Yes, ask: When was the last time? and What happened that time?      Yes happens a couple times a week at most  6. CAUSE: What do you think is causing the headache?     Migraines chronic  7. MIGRAINE: Have you been diagnosed with migraine headaches? If Yes, ask: Is this headache similar?      Yes, prescribed sumatriptan  over the summer   8. HEAD INJURY: Has there been any recent injury to your head?      No  9. OTHER SYMPTOMS: Do you have any other symptoms? (e.g., fever, stiff neck, eye pain, sore throat, cold symptoms)     Insomnia (chronic), lack of energy  Protocols used: Headache-A-AH

## 2024-07-04 ENCOUNTER — Other Ambulatory Visit: Payer: Self-pay

## 2024-07-04 ENCOUNTER — Ambulatory Visit
Admission: EM | Admit: 2024-07-04 | Discharge: 2024-07-04 | Disposition: A | Discharge status: Home / Self Care | Payer: MEDICAID

## 2024-07-04 DIAGNOSIS — G43E19 Chronic migraine with aura, intractable, without status migrainosus: Secondary | ICD-10-CM | POA: Diagnosis not present

## 2024-07-04 HISTORY — DX: Unspecified convulsions: R56.9

## 2024-07-04 HISTORY — DX: Migraine, unspecified, not intractable, without status migrainosus: G43.909

## 2024-07-04 HISTORY — DX: Anemia, unspecified: D64.9

## 2024-07-04 MED ORDER — SUMATRIPTAN SUCCINATE 25 MG PO TABS: 25.0000 mg | ORAL_TABLET | ORAL | 0 refills | Status: DC | PRN

## 2024-07-04 MED ORDER — DEXAMETHASONE SOD PHOSPHATE PF 10 MG/ML IJ SOLN
8.0000 mg | Freq: Once | INTRAMUSCULAR | Status: AC
  Administered 2024-07-04: 8 mg via INTRAMUSCULAR

## 2024-07-04 MED ORDER — ONDANSETRON HCL 8 MG PO TABS: 8.0000 mg | ORAL_TABLET | Freq: Three times a day (TID) | ORAL | 0 refills | Status: DC | PRN

## 2024-07-04 MED ORDER — KETOROLAC TROMETHAMINE 30 MG/ML IJ SOLN
30.0000 mg | Freq: Once | INTRAMUSCULAR | Status: AC
  Administered 2024-07-04: 30 mg via INTRAMUSCULAR

## 2024-07-04 NOTE — ED Provider Notes (Signed)
 Emily Norton CARE    CSN: [id number] Arrival date & time: 07/04/24  1549      History   Chief Complaint No chief complaint on file.   HPI Emily Norton is a 48 y.o. female.   Medically complicated patient who has not been seen for some time due to insurance problems.  Is here today complaining of headache.  She states that she has headaches every few days all month long.  Is not currently on any prevention.  She is out of all of her medicine, previously had Imitrex  and Zofran  to take for her headaches.  Would like to have her medicines refill and would like treatment for the today's headache.  She has been taking Tylenol  and ibuprofen  with no improvement.  She has not had any trauma or head injury.  No recent sinus infection or cold. Patient has significant GERD.  She states that all treatments to date have failed.  Is on no medication.  Is on a limited diet.  Does not smoke cigarettes, or drink alcohol.  She does take NSAID drugs for her headaches.  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  Has had to have heart surgery (?) Patient would like referral to a PCP    Past Medical History:  Diagnosis Date   Anemia    Difficult airway for intubation, initial encounter 02/10/2022   per patient and states information from [place]  hospital   GERD (gastroesophageal reflux disease)    History of toe surgery    Hypokalemia 02/03/2021   Hypothyroidism 01/29/2021   Migraine    Seizure (HCC)    Thrombocytopenia 04/16/2019    Patient Active Problem List   Diagnosis Date Noted   Congenital hypertrophic pyloric stenosis (HCC)    Adenomatous polyp of ascending colon    Difficult airway for intubation, initial encounter 02/10/2022   Cerumen impaction 12/31/2021   Blood donor, plasma 12/17/2021   Syncope, near 03/04/2021   Back pain 03/04/2021   Dysphagia 03/04/2021   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   Insomnia  03/04/2021   GERD (gastroesophageal reflux disease) 01/29/2021   Hypothyroid 01/29/2021   Major depressive disorder 01/29/2021   Generalized anxiety disorder 01/29/2021   Iron deficiency anemia 01/29/2021   Migraines 01/29/2021   Healthcare maintenance 01/29/2021    Past Surgical History:  Procedure Laterality Date   BALLOON DILATION N/A 04/15/2022   Procedure: BALLOON DILATION;  Surgeon: San Sandor GAILS, DO;  Location: [facility];  Service: Gastroenterology;  Laterality: N/A;   BIOPSY  04/15/2022   Procedure: BIOPSY;  Surgeon: San Sandor GAILS, DO;  Location: [facility];  Service: Gastroenterology;;   BREAST BIOPSY Bilateral 03/13/2021   BREAST EXCISIONAL BIOPSY Right 09/06/2012   (-)   CHOLECYSTECTOMY     COLONOSCOPY  2013   [facility]   COLONOSCOPY WITH PROPOFOL  N/A 04/15/2022   Procedure: COLONOSCOPY WITH PROPOFOL ;  Surgeon: San Sandor GAILS, DO;  Location: [facility];  Service: Gastroenterology;  Laterality: N/A;   ESOPHAGOGASTRODUODENOSCOPY  2013   [facility]   ESOPHAGOGASTRODUODENOSCOPY (EGD) WITH PROPOFOL  N/A 04/15/2022   Procedure: ESOPHAGOGASTRODUODENOSCOPY (EGD) WITH PROPOFOL ;  Surgeon: San Sandor GAILS, DO;  Location: [facility];  Service: Gastroenterology;  Laterality: N/A;   ESOPHAGUS SURGERY     open heart surgery  2013   thoracic surgery per pt   POLYPECTOMY  04/15/2022   Procedure: POLYPECTOMY;  Surgeon: Cirigliano, Vito V,  DO;  Location: [facility];  Service: Gastroenterology;;    OB History     Gravida  0   Para  0   Term  0   Preterm  0   AB  0   Living  0      SAB  0   IAB  0   Ectopic  0   Multiple  0   Live Births  0            Home Medications    Prior to Admission medications   Medication Sig Start Date End Date Taking? Authorizing Provider  acetaminophen  (TYLENOL ) 650 MG CR tablet Take 650 mg by mouth every 8 (eight) hours as needed for pain.   Yes [provider]  caffeine  200 MG TABS tablet Take 200 mg by mouth.   Yes [provider]  ondansetron  (ZOFRAN ) 8 MG tablet Take 1 tablet (8 mg total) by mouth every 8 (eight) hours as needed for nausea or vomiting. 07/04/24  Yes Maranda Jamee Jacob, MD  ibuprofen  (ADVIL ) 600 MG tablet Take 1 tablet (600 mg total) by mouth every 8 (eight) hours as needed. Patient taking differently: Take 600 mg by mouth every 8 (eight) hours as needed for mild pain, moderate pain or headache. 09/24/21   Janit Thresa HERO, DPM  SUMAtriptan  (IMITREX ) 25 MG tablet Take 1 tablet (25 mg total) by mouth every 2 (two) hours as needed for migraine. May repeat in 2 hours if headache persists or recurs. 07/04/24   Maranda Jamee Jacob, MD    Family History Family History  Problem Relation Age of Onset   Breast cancer Mother    Migraines Father    BRCA 1/2 Sister        brca+. had preventive dbl mastectomy   Heart attack Maternal Grandfather    Breast cancer Cousin    Colon cancer Neg Hx    Esophageal cancer Neg Hx     Social History Social History   Tobacco Use   Smoking status: Never   Smokeless tobacco: Never  Vaping Use   Vaping status: Never Used  Substance Use Topics   Alcohol use: Yes    Comment: ocassional   Drug use: Not Currently     Allergies   Patient has no known allergies.   Review of Systems Review of Systems See HPI  Physical Exam Triage Vital Signs ED Triage Vitals  Encounter Vitals Group     BP 07/04/24 1600 110/71     Girls Systolic BP Percentile --      Girls Diastolic BP Percentile --      Boys Systolic BP Percentile --      Boys Diastolic BP Percentile --      Pulse Rate 07/04/24 1600 97     Resp 07/04/24 1600 16     Temp 07/04/24 1600 98.6 F (37 C)     Temp src --      SpO2 07/04/24 1600 96 %     Weight --      Height --      Head Circumference --      Peak Flow --      Pain Score 07/04/24 1604 7     Pain Loc --      Pain Education --      Exclude from Growth Chart --    No data  found.  Updated Vital Signs BP 110/71   Pulse 97   Temp 98.6 F (37  C)   Resp 16   LMP 06/26/2024 (Approximate)   SpO2 96%      Physical Exam Constitutional:      General: She is not in acute distress.    Appearance: She is well-developed.  HENT:     Head: Normocephalic and atraumatic.     Right Ear: Tympanic membrane normal.     Left Ear: Tympanic membrane normal.     Nose: Nose normal.     Mouth/Throat:     Mouth: Mucous membranes are moist.  Eyes:     Extraocular Movements: Extraocular movements intact.     Conjunctiva/sclera: Conjunctivae normal.     Pupils: Pupils are equal, round, and reactive to light.  Cardiovascular:     Rate and Rhythm: Normal rate and regular rhythm.     Heart sounds: Normal heart sounds.     Comments: Well healed sternotomy scar Pulmonary:     Effort: Pulmonary effort is normal. No respiratory distress.     Breath sounds: Normal breath sounds.  Musculoskeletal:        General: Normal range of motion.     Cervical back: Normal range of motion.  Skin:    General: Skin is warm and dry.  Neurological:     General: No focal deficit present.     Mental Status: She is alert.      UC Treatments / Results  Labs (all labs ordered are listed, but only abnormal results are displayed) Labs Reviewed - No data to display  EKG   Radiology No results found.  Procedures Procedures (including critical care time)  Medications Ordered in UC Medications  ketorolac (TORADOL) 30 MG/ML injection 30 mg (30 mg Intramuscular Given 07/04/24 1645)  dexamethasone  (DECADRON ) injection 8 mg (8 mg Intramuscular Given 07/04/24 1645)    Initial Impression / Assessment and Plan / UC Course  I have reviewed the triage vital signs and the nursing notes.  Pertinent labs & imaging results that were available during my care of the patient were reviewed by me and considered in my medical decision making (see chart for details).     Final Clinical  Impressions(s) / UC Diagnoses   Final diagnoses:  Intractable chronic migraine with aura and without status migrainosus     Discharge Instructions      I have refilled your medication You need to schedule with a PCP  There is a family practice clinic in [place] There is a family practice here ( Dr Parnell is not taking new patients)  Also an internal medicine doctor in [place]: Cathlyn Nash, MD-  [phone]   ED Prescriptions     Medication Sig Dispense Auth. Provider   ondansetron  (ZOFRAN ) 8 MG tablet Take 1 tablet (8 mg total) by mouth every 8 (eight) hours as needed for nausea or vomiting. 20 tablet Maranda Jamee Jacob, MD   SUMAtriptan  (IMITREX ) 25 MG tablet Take 1 tablet (25 mg total) by mouth every 2 (two) hours as needed for migraine. May repeat in 2 hours if headache persists or recurs. 10 tablet Maranda Jamee Jacob, MD      PDMP not reviewed this encounter.   Maranda Jamee Jacob, MD 07/04/24 [phone]

## 2024-07-04 NOTE — Discharge Instructions (Addendum)
 I have refilled your medication You need to schedule with a PCP  There is a family practice clinic in [place] There is a family practice here ( Dr Parnell is not taking new patients)  Also an internal medicine doctor in [place]: Cathlyn Nash, MD-  [phone]

## 2024-07-04 NOTE — ED Triage Notes (Signed)
 Has c/o migraines for years. Has had migraines at least once a week. Has migraine right now, that started yesterday. Took tylenol . [facility]. She wants a shot and more sumatriptan .

## 2024-07-09 ENCOUNTER — Ambulatory Visit: Payer: MEDICAID | Admitting: Podiatry

## 2024-07-12 ENCOUNTER — Encounter (HOSPITAL_BASED_OUTPATIENT_CLINIC_OR_DEPARTMENT_OTHER): Payer: Self-pay | Admitting: Family Medicine

## 2024-07-12 ENCOUNTER — Ambulatory Visit (INDEPENDENT_AMBULATORY_CARE_PROVIDER_SITE_OTHER): Payer: MEDICAID | Admitting: Family Medicine

## 2024-07-12 VITALS — BP 122/88 | HR 86 | Temp 99.6°F | Resp 16 | Ht <= 58 in | Wt 122.5 lb

## 2024-07-12 DIAGNOSIS — R5383 Other fatigue: Secondary | ICD-10-CM

## 2024-07-12 DIAGNOSIS — R110 Nausea: Secondary | ICD-10-CM | POA: Diagnosis not present

## 2024-07-12 DIAGNOSIS — K219 Gastro-esophageal reflux disease without esophagitis: Secondary | ICD-10-CM

## 2024-07-12 DIAGNOSIS — G43409 Hemiplegic migraine, not intractable, without status migrainosus: Secondary | ICD-10-CM

## 2024-07-12 MED ORDER — SUMATRIPTAN SUCCINATE 50 MG PO TABS: 50.0000 mg | ORAL_TABLET | Freq: Once | ORAL | 0 refills | Status: DC

## 2024-07-12 MED ORDER — AMITRIPTYLINE HCL 25 MG PO TABS: 25.0000 mg | ORAL_TABLET | Freq: Every day | ORAL | 1 refills | Status: DC

## 2024-07-12 NOTE — Progress Notes (Unsigned)
 Established Patient Office Visit  Subjective   Patient ID: Emily Norton, female    DOB: 1976-07-11  Age: 48 y.o. MRN: [id number]  Chief Complaint  Patient presents with   Establish Care    Establish Care     F/u as above.  Known to [facility] but new to my practice.  She is frustrated with chronic insomnia.  Also needs f/u of her chronic migraines need follow up.  Needs her Imitrex  nearly every day unfortunately.  Her GERD is uncontrolled and keeps her up at night.  Decided to stop her PPI therapy some time ago because it just never helped.  She does have a h/o emotional trauma in years past.  Admits daily caffeine and understands she must avoid it in the evening.    Past Medical History:  Diagnosis Date   Anxiety    Chronic insomnia    GERD (gastroesophageal reflux disease)    known to [facility]   Hypothyroidism 01/29/2021   Migraine    Known to Neurology    Outpatient Encounter Medications as of 07/12/2024  Medication Sig   acetaminophen  (TYLENOL ) 650 MG CR tablet Take 650 mg by mouth every 8 (eight) hours as needed for pain.   amitriptyline  (ELAVIL ) 25 MG tablet Take 1 tablet (25 mg total) by mouth at bedtime.   caffeine 200 MG TABS tablet Take 200 mg by mouth.   ibuprofen  (ADVIL ) 600 MG tablet Take 1 tablet (600 mg total) by mouth every 8 (eight) hours as needed.   ondansetron  (ZOFRAN ) 8 MG tablet Take 1 tablet (8 mg total) by mouth every 8 (eight) hours as needed for nausea or vomiting.   [DISCONTINUED] SUMAtriptan  (IMITREX ) 25 MG tablet Take 1 tablet (25 mg total) by mouth every 2 (two) hours as needed for migraine. May repeat in 2 hours if headache persists or recurs.   SUMAtriptan  (IMITREX ) 50 MG tablet Take 1 tablet (50 mg total) by mouth once for 1 dose. May repeat in 2 hours if headache persists or recurs.   No facility-administered encounter medications on file as of 07/12/2024.    Social History   Tobacco Use   Smoking status: Never   Smokeless  tobacco: Never  Vaping Use   Vaping status: Never Used  Substance Use Topics   Alcohol use: Yes    Comment: ocassional   Drug use: Not Currently    {History (Optional):23778}  ROS    Objective:     BP 122/88 (Cuff Size: Normal)   Pulse 86   Temp 99.6 F (37.6 C) (Oral)   Resp 16   Ht 4' 8.69 (1.44 m)   Wt 122 lb 8 oz (55.6 kg)   LMP 06/26/2024 (Approximate)   SpO2 97%   BMI 26.80 kg/m  {Vitals History (Optional):23777}  Physical Exam Constitutional:      General: She is not in acute distress.    Appearance: Normal appearance.  HENT:     Head: Normocephalic.  Neck:     Vascular: No carotid bruit.  Cardiovascular:     Rate and Rhythm: Normal rate and regular rhythm.     Pulses: Normal pulses.     Heart sounds: Normal heart sounds.  Pulmonary:     Effort: Pulmonary effort is normal.     Breath sounds: Normal breath sounds.  Abdominal:     General: Bowel sounds are normal.     Palpations: Abdomen is soft.  Musculoskeletal:     Cervical back: Neck supple.  No tenderness.     Right lower leg: No edema.     Left lower leg: No edema.  Neurological:     Mental Status: She is alert.      No results found for any visits on 07/12/24.  {Labs (Optional):23779}  The ASCVD Risk score (Arnett DK, et al., 2019) failed to calculate for the following reasons:   Cannot find a previous HDL lab   Cannot find a previous total cholesterol lab    Assessment & Plan:  Hemiplegic migraine without status migrainosus, not intractable -     SUMAtriptan  Succinate; Take 1 tablet (50 mg total) by mouth once for 1 dose. May repeat in 2 hours if headache persists or recurs.  Dispense: 20 tablet; Refill: 0  Gastroesophageal reflux disease, unspecified whether esophagitis present -     Ambulatory referral to Gastroenterology  Fatigue, unspecified type -     CBC with Differential/Platelet -     Comprehensive metabolic panel with GFR -     TSH -     Vitamin B12  Other orders -      Amitriptyline  HCl; Take 1 tablet (25 mg total) by mouth at bedtime.  Dispense: 30 tablet; Refill: 1    No follow-ups on file.    REDDING PONCE NORLEEN FALCON., MD

## 2024-07-13 ENCOUNTER — Ambulatory Visit (HOSPITAL_BASED_OUTPATIENT_CLINIC_OR_DEPARTMENT_OTHER): Payer: Self-pay | Admitting: Family Medicine

## 2024-07-13 ENCOUNTER — Ambulatory Visit: Payer: MEDICAID | Admitting: Obstetrics

## 2024-07-13 DIAGNOSIS — R110 Nausea: Secondary | ICD-10-CM | POA: Insufficient documentation

## 2024-07-13 LAB — COMPREHENSIVE METABOLIC PANEL WITH GFR
ALT: 7 IU/L (ref 0–32)
AST: 10 IU/L (ref 0–40)
Albumin: 4.4 g/dL (ref 3.9–4.9)
Alkaline Phosphatase: 73 IU/L (ref 41–116)
BUN/Creatinine Ratio: 26 — ABNORMAL HIGH (ref 9–23)
BUN: 17 mg/dL (ref 6–24)
Bilirubin Total: 0.7 mg/dL (ref 0.0–1.2)
CO2: 25 mmol/L (ref 20–29)
Calcium: 9.5 mg/dL (ref 8.7–10.2)
Chloride: 103 mmol/L (ref 96–106)
Creatinine, Ser: 0.66 mg/dL (ref 0.57–1.00)
Globulin, Total: 2.3 g/dL (ref 1.5–4.5)
Glucose: 83 mg/dL (ref 70–99)
Potassium: 4.5 mmol/L (ref 3.5–5.2)
Sodium: 142 mmol/L (ref 134–144)
Total Protein: 6.7 g/dL (ref 6.0–8.5)
eGFR: 107 mL/min/1.73 (ref >59)

## 2024-07-13 LAB — CBC WITH DIFFERENTIAL/PLATELET
Basophils Absolute: 0.1 x10E3/uL (ref 0.0–0.2)
Basos: 1 %
EOS (ABSOLUTE): 0.2 x10E3/uL (ref 0.0–0.4)
Eos: 4 %
Hematocrit: 39.6 % (ref 34.0–46.6)
Hemoglobin: 12.7 g/dL (ref 11.1–15.9)
Immature Grans (Abs): 0 x10E3/uL (ref 0.0–0.1)
Immature Granulocytes: 0 %
Lymphocytes Absolute: 1 x10E3/uL (ref 0.7–3.1)
Lymphs: 17 %
MCH: 29.3 pg (ref 26.6–33.0)
MCHC: 32.1 g/dL (ref 31.5–35.7)
MCV: 91 fL (ref 79–97)
Monocytes Absolute: 0.5 x10E3/uL (ref 0.1–0.9)
Monocytes: 9 %
Neutrophils Absolute: 4.1 x10E3/uL (ref 1.4–7.0)
Neutrophils: 69 %
Platelets: 169 x10E3/uL (ref 150–450)
RBC: 4.34 x10E6/uL (ref 3.77–5.28)
RDW: 14.9 % (ref 11.7–15.4)
WBC: 5.8 x10E3/uL (ref 3.4–10.8)

## 2024-07-13 LAB — VITAMIN B12: Vitamin B-12: 663 pg/mL (ref 232–1245)

## 2024-07-13 LAB — TSH: TSH: 2.76 u[IU]/mL (ref 0.450–4.500)

## 2024-07-13 MED ORDER — ONDANSETRON HCL 8 MG PO TABS: 8.0000 mg | ORAL_TABLET | Freq: Three times a day (TID) | ORAL | 0 refills | Status: DC | PRN

## 2024-07-13 NOTE — Assessment & Plan Note (Addendum)
 Lifestyle changes reviewed in detail.  I advised a PPI in the morning before breakfast and Famotidine at hs.  She is frustrated because these meds never work for me, and declines new rxs for now.  Will send her back to GI.  She has many issues today, and will see her back in only one week.

## 2024-07-13 NOTE — Assessment & Plan Note (Signed)
 Keep headache diary.  Obviously needs better migraine prophylaxis.  Try to cut back on the Imitrex  and alert us  if problems with the new medication.

## 2024-07-19 ENCOUNTER — Ambulatory Visit (HOSPITAL_BASED_OUTPATIENT_CLINIC_OR_DEPARTMENT_OTHER): Payer: MEDICAID | Admitting: Family Medicine

## 2024-07-24 ENCOUNTER — Encounter (HOSPITAL_BASED_OUTPATIENT_CLINIC_OR_DEPARTMENT_OTHER): Payer: Self-pay | Admitting: Family Medicine

## 2024-07-24 ENCOUNTER — Ambulatory Visit (INDEPENDENT_AMBULATORY_CARE_PROVIDER_SITE_OTHER): Payer: MEDICAID | Admitting: Family Medicine

## 2024-07-24 VITALS — BP 137/87 | HR 88 | Temp 98.3°F | Resp 16 | Wt 123.8 lb

## 2024-07-24 DIAGNOSIS — F419 Anxiety disorder, unspecified: Secondary | ICD-10-CM

## 2024-07-24 DIAGNOSIS — K219 Gastro-esophageal reflux disease without esophagitis: Secondary | ICD-10-CM

## 2024-07-24 DIAGNOSIS — Z1231 Encounter for screening mammogram for malignant neoplasm of breast: Secondary | ICD-10-CM

## 2024-07-24 DIAGNOSIS — H6123 Impacted cerumen, bilateral: Secondary | ICD-10-CM

## 2024-07-24 DIAGNOSIS — G43409 Hemiplegic migraine, not intractable, without status migrainosus: Secondary | ICD-10-CM | POA: Diagnosis not present

## 2024-07-24 MED ORDER — SUMATRIPTAN SUCCINATE 100 MG PO TABS: 100.0000 mg | ORAL_TABLET | ORAL | 3 refills | Status: AC | PRN

## 2024-07-24 NOTE — Progress Notes (Unsigned)
 Established Patient Office Visit  Subjective   Patient ID: Emily Norton, female    DOB: [birth year]  Age: 48 y.o. MRN: [id number]  Chief Complaint  Patient presents with   Follow-up    Follow-up    F/u as above.  Please see last note for details.  Sumatriptan  working relatively well, but she prefers a higher dosage which is fine.  Elavil  somewhat helpful in the evenings, but it sounds like her prophylaxis isn't yet optimal.  Recent labs were fine.  She admits she hasn't called to get scheduled with GI yet, and I urged her to proceed with this.    Past Medical History:  Diagnosis Date   Anxiety    Chronic insomnia    GERD (gastroesophageal reflux disease)    known to [facility], with GI f/u advised   Hypothyroidism 01/29/2021   Migraine    Known to Neurology    Outpatient Encounter Medications as of 07/24/2024  Medication Sig   acetaminophen  (TYLENOL ) 650 MG CR tablet Take 650 mg by mouth every 8 (eight) hours as needed for pain.   amitriptyline  (ELAVIL ) 25 MG tablet Take 1 tablet (25 mg total) by mouth at bedtime.   caffeine 200 MG TABS tablet Take 200 mg by mouth.   ibuprofen  (ADVIL ) 600 MG tablet Take 1 tablet (600 mg total) by mouth every 8 (eight) hours as needed.   ondansetron  (ZOFRAN ) 8 MG tablet Take 1 tablet (8 mg total) by mouth every 8 (eight) hours as needed for nausea or vomiting.   [DISCONTINUED] SUMAtriptan  (IMITREX ) 50 MG tablet Take 1 tablet (50 mg total) by mouth once for 1 dose. May repeat in 2 hours if headache persists or recurs.   SUMAtriptan  (IMITREX ) 100 MG tablet Take 1 tablet (100 mg total) by mouth every 2 (two) hours as needed for migraine. May repeat in 2 hours if headache persists or recurs.   No facility-administered encounter medications on file as of 07/24/2024.    Social History   Tobacco Use   Smoking status: Never   Smokeless tobacco: Never  Vaping Use   Vaping status: Never Used  Substance Use Topics   Alcohol use: Yes     Comment: ocassional   Drug use: Not Currently    {History (Optional):23778}  ROS    Objective:     BP 137/87 (Cuff Size: Normal)   Pulse 88   Temp 98.3 F (36.8 C) (Oral)   Resp 16   Wt 123 lb 12.8 oz (56.2 kg)   LMP 06/26/2024 (Approximate)   SpO2 97%   BMI 27.08 kg/m  {Vitals History (Optional):23777}  Physical Exam Constitutional:      General: She is not in acute distress.    Appearance: Normal appearance.  HENT:     Head: Normocephalic.  Neck:     Vascular: No carotid bruit.  Cardiovascular:     Rate and Rhythm: Normal rate and regular rhythm.     Pulses: Normal pulses.     Heart sounds: Normal heart sounds.  Pulmonary:     Effort: Pulmonary effort is normal.     Breath sounds: Normal breath sounds.  Abdominal:     General: Bowel sounds are normal.     Palpations: Abdomen is soft.  Musculoskeletal:     Cervical back: Neck supple. No tenderness.     Right lower leg: No edema.     Left lower leg: No edema.  Neurological:     Mental Status: She is alert.  No results found for any visits on 07/24/24.  {Labs (Optional):23779}  The ASCVD Risk score (Arnett DK, et al., 2019) failed to calculate for the following reasons:   Cannot find a previous HDL lab   Cannot find a previous total cholesterol lab    Assessment & Plan:  Gastroesophageal reflux disease, unspecified whether esophagitis present  Hemiplegic migraine without status migrainosus, not intractable -     SUMAtriptan  Succinate; Take 1 tablet (100 mg total) by mouth every 2 (two) hours as needed for migraine. May repeat in 2 hours if headache persists or recurs.  Dispense: 20 tablet; Refill: 3  Bilateral impacted cerumen    Return in about 3 months (around 10/24/2024) for chronic follow-up.    REDDING PONCE NORLEEN FALCON., MD

## 2024-07-25 ENCOUNTER — Encounter (HOSPITAL_BASED_OUTPATIENT_CLINIC_OR_DEPARTMENT_OTHER): Payer: Self-pay | Admitting: Family Medicine

## 2024-07-25 DIAGNOSIS — F419 Anxiety disorder, unspecified: Secondary | ICD-10-CM | POA: Insufficient documentation

## 2024-07-25 NOTE — Assessment & Plan Note (Signed)
 Sumatriptan  dosage increased.  Keep HA diary.  May eventually need a different prophylactic agent.

## 2024-07-25 NOTE — Assessment & Plan Note (Signed)
 F/u with GI as directed

## 2024-07-25 NOTE — Assessment & Plan Note (Signed)
 Improved today.

## 2024-08-06 ENCOUNTER — Ambulatory Visit: Payer: MEDICAID | Admitting: Podiatry

## 2024-08-06 DIAGNOSIS — M205X1 Other deformities of toe(s) (acquired), right foot: Secondary | ICD-10-CM | POA: Diagnosis not present

## 2024-08-06 DIAGNOSIS — M19071 Primary osteoarthritis, right ankle and foot: Secondary | ICD-10-CM

## 2024-08-06 NOTE — Patient Instructions (Signed)
I have ordered a MRI of the right foot. If you do not hear for them about scheduling within the next 1 week, or you have any questions please give us a call at [phone].   

## 2024-08-07 NOTE — Progress Notes (Addendum)
"  °  Subjective:  Patient ID: Emily Norton, female    DOB: February 13, 1976,  MRN: [id number]  Chief Complaint  Patient presents with   Toe Pain    Right foot would like 2nd opinion on 2nd and 3rd toe     Discussed the use of AI scribe software for clinical note transcription with the patient, who gave verbal consent to proceed.  History of Present Illness Emily Norton is a 48 year old female who presents with persistent pain and deformity in her toes following previous surgeries.  She had two foot surgeries. The first in summer 2022 was to straighten toes four and five. The second was to correct the big toe in order to decrease the pressure on the second and third digit.   She had temporary relief after surgery, then developed persistent pain, worse with standing and walking. 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 She notices an abnormal gait which has been chronic.  Her aunt had similar overlapping toes, suggesting a familial pattern.      Objective:  There were no vitals filed for this visit.  Physical Exam General: AAO x3, NAD  Dermatological: Skin is warm, dry and supple bilateral. There are no open sores, no preulcerative lesions, no rash or signs of infection present.  Vascular: Dorsalis Pedis artery and Posterior Tibial artery pedal pulses are 2/4 bilateral with immedate capillary fill time. There is no pain with calf compression, swelling, warmth, erythema.   Neruologic: Grossly intact via light touch bilateral.   Musculoskeletal: Tenderness is mostly noted along the third digit.  Contracture noted mostly at the DIPJ.  Upon weightbearing evaluation of the toes straighten out mostly.  Nonweightbearing valuation does reveal the 2nd and 3rd toes are rubbing.         Results RADIOLOGY Foot X-ray: Previous surgical changes are noted.  No evidence of acute fracture.  Digital deformity present.   Assessment:   1.  Arthritis of joint of lesser toe, right   2. Toe contracture, right      Plan:  Patient was evaluated and treated and all questions answered.  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  - Given history of multiple surgeries and continued pain I will rule out other causes and further evaluate.  Ordered MRI of right foot to assess soft tissue and tendons. - Consider surgical intervention based on MRI findings.  Discussed that revision surgery is not a guarantee of resolution of symptoms. - Advised on appropriate footwear.  Gait abnormality - Discussed impact of gait on toe deformity and pain.   No follow-ups on file.   Emily Norton DPM  - Addendum: MRI was changed to [facility] per request. Completed treatment history form 09/28/24 "

## 2024-08-09 ENCOUNTER — Inpatient Hospital Stay (HOSPITAL_BASED_OUTPATIENT_CLINIC_OR_DEPARTMENT_OTHER): Admission: RE | Admit: 2024-08-09 | Payer: MEDICAID | Source: Ambulatory Visit | Admitting: Radiology

## 2024-08-14 ENCOUNTER — Other Ambulatory Visit: Payer: Self-pay | Admitting: Obstetrics

## 2024-08-14 DIAGNOSIS — Z1231 Encounter for screening mammogram for malignant neoplasm of breast: Secondary | ICD-10-CM

## 2024-08-20 ENCOUNTER — Encounter (HOSPITAL_BASED_OUTPATIENT_CLINIC_OR_DEPARTMENT_OTHER): Payer: Self-pay

## 2024-08-22 ENCOUNTER — Encounter: Payer: Self-pay | Admitting: Gastroenterology

## 2024-08-22 ENCOUNTER — Telehealth: Payer: Self-pay

## 2024-08-22 ENCOUNTER — Other Ambulatory Visit (HOSPITAL_COMMUNITY): Payer: Self-pay

## 2024-08-22 ENCOUNTER — Ambulatory Visit: Payer: MEDICAID | Admitting: Gastroenterology

## 2024-08-22 ENCOUNTER — Other Ambulatory Visit (HOSPITAL_COMMUNITY): Payer: Self-pay | Admitting: Gastroenterology

## 2024-08-22 VITALS — BP 100/60 | HR 87 | Ht <= 58 in | Wt 121.2 lb

## 2024-08-22 DIAGNOSIS — R1013 Epigastric pain: Secondary | ICD-10-CM | POA: Diagnosis not present

## 2024-08-22 DIAGNOSIS — R1319 Other dysphagia: Secondary | ICD-10-CM | POA: Diagnosis not present

## 2024-08-22 DIAGNOSIS — R058 Other specified cough: Secondary | ICD-10-CM | POA: Diagnosis not present

## 2024-08-22 DIAGNOSIS — R1312 Dysphagia, oropharyngeal phase: Secondary | ICD-10-CM | POA: Diagnosis not present

## 2024-08-22 DIAGNOSIS — R194 Change in bowel habit: Secondary | ICD-10-CM

## 2024-08-22 DIAGNOSIS — R110 Nausea: Secondary | ICD-10-CM

## 2024-08-22 DIAGNOSIS — R1310 Dysphagia, unspecified: Secondary | ICD-10-CM

## 2024-08-22 DIAGNOSIS — K2100 Gastro-esophageal reflux disease with esophagitis, without bleeding: Secondary | ICD-10-CM

## 2024-08-22 MED ORDER — DEXLANSOPRAZOLE 60 MG PO CPDR: 60.0000 mg | DELAYED_RELEASE_CAPSULE | Freq: Every day | ORAL | 3 refills | Status: AC

## 2024-08-22 MED ORDER — FAMOTIDINE 40 MG PO TABS: 40.0000 mg | ORAL_TABLET | Freq: Every day | ORAL | 0 refills | Status: AC

## 2024-08-22 NOTE — Telephone Encounter (Signed)
 Pharmacy Patient Advocate Encounter  Received notification from [facility] MEDICAID that Prior Authorization for Dexlansoprazole  60MG  dr capsules has been APPROVED from 08-22-2024 to 08-22-2025   PA #/Case ID/Reference #: [id number]

## 2024-08-22 NOTE — Progress Notes (Signed)
 Chief Complaint: follow-up GERD Primary GI Doctor:Dr. San  HPI:  [name] with past medical history of GERD, depression, GAD, anxiety, insomnia, migraines, hypothyroidism, thrombocytopenia, who presents for a evaluation of worsening GERD.    GI history:  She reports having a longstanding history of IDA.  Dates back to at least 2013 and reports having an evaluation while living in [place], to include EGD (normal), colonoscopy (normal), and VCE (small bowel ulcer, managed medically).  Previously took OTC iron supplements, complicated by diarrhea so she stopped taking.    Additionally, longstanding history of dysphagia.  Reports having several EGDs with dilations over the years, with last esophageal balloon dilatation in 2014 in [place].  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  No records available for review.  Now with intermittent solid food dysphagia, but no history of food impactions.  Overall, symptoms not too bothersome now.  -11/22/2018: CT chest: Right-sided aortic arch.  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  This is presumably secondary to repair of symptomatic vascular ring in the past.  Right subclavian and right common carotid arteries have separate origins of the aortic arch and are patent - 04/10/2021: Barium esophagram: Mild narrowing and transient hold-up of contrast within the esophagus is surrounded by the right aortic arch, trachea, and conduit graft.  No hiatal hernia, no spontaneous reflux, the reflux present with water siphon test.  Barium tablet passed without difficulty.  Separately, longstanding history of GERD, with index symptoms of heartburn, regurgitation, waterbrash.  Can have nocturnal choking and coughing.  She is prescribed Protonix  40 mg/day, but  takes prn. Continues to mainly have nocturnal breakthrough reflux symptoms   Last colonoscopy was 2013 that normal/no polyps per patient.   No previous GI records available for review. She does not recall the name of her previous gastroenterologist or CT surgeon in [place].   04/15/22 EGD: LA Grade D reflux esophagitis with bleeding. Biopsied.  Normal gastric fundus, gastric body, incisura and antrum. Gastric stenosis was found at the pylorus. Dilated. Biopsied. Normal duodenal bulb, first portion of the duodenum and second portion of the duodenum.  04/15/22 colonoscopy: One 4 mm polyp in the ascending colon, removed with a cold snare. Resected and retrieved. The examined portion of the ileum was normal. Recall 7 years.  Interval History Patient last seen in GI office on 01/11/22 by Dr. San for Iron deficiency anemia, GERD, dysphagia.  Patient presents for evaluation of GERD and constipation. Accompanied by friend.   Patient presents with worsening reflux over the course of the last year or more. She reports having epigastric and pyrosis on daily basis. She will have intermittent nausea with dry heaves after eating certain foods. Patient likes to eat a lot italian food, pizza, and Mexican. Patient also has nonproductive cough.  Patient reports having esophageal spasms that are painful.  Patient will wake up in the middle of the night with epigastric pain which effects her sleep. Can have nocturnal choking and coughing.  She is currently taking OTC Tums and goes through whole bottle in few days She has tried Nexium, Omeprazole, Lansoprazole,and Pantoprazole  40 mg without much help. She is taking ondansetron  for nausea prn.  She has esophageal dysphagia with solids and liquids (carbonated drinks and wine).  Patient reports taking OTC ibuprofen  alternating with tylenol  prn  for back pain.   She complains of having issues with constipation about once a week where she has small rock shaped stools.  No rectal bleeding.   She reports she has had a lot of issues with anxiety and depression, currently looking for counselor for behavioral therapy.   Patient's family history includes:no colon CA, no esophageal CA, father with GERD  Wt Readings from Last 3 Encounters:  08/22/24 121 lb 4 oz (55 kg)  07/24/24 123 lb 12.8 oz (56.2 kg)  07/12/24 122 lb 8 oz (55.6 kg)    Past Medical History:  Diagnosis Date   Anxiety    Chronic insomnia    GERD (gastroesophageal reflux disease)    Clinically severe and known to [facility], with GI f/u advised   Hypothyroidism 01/29/2021   Migraine    Known to Neurology   Muscle spasm    Ulcer (traumatic) of oral mucosa     Past Surgical History:  Procedure Laterality Date   BALLOON DILATION N/A 04/15/2022   Procedure: BALLOON DILATION;  Surgeon: San Sandor GAILS, DO;  Location: [facility];  Service: Gastroenterology;  Laterality: N/A;   BIOPSY  04/15/2022   Procedure: BIOPSY;  Surgeon: San Sandor GAILS, DO;  Location: [facility];  Service: Gastroenterology;;   BREAST BIOPSY Bilateral 03/13/2021   BREAST EXCISIONAL BIOPSY Right 09/06/2012   (-)   CHOLECYSTECTOMY     COLONOSCOPY  2013   [facility]   COLONOSCOPY WITH PROPOFOL  N/A 04/15/2022   Procedure: COLONOSCOPY WITH PROPOFOL ;  Surgeon: San Sandor GAILS, DO;  Location: [facility];  Service: Gastroenterology;  Laterality: N/A;   ESOPHAGOGASTRODUODENOSCOPY  2013   [facility]   ESOPHAGOGASTRODUODENOSCOPY (EGD) WITH PROPOFOL  N/A 04/15/2022   Procedure: ESOPHAGOGASTRODUODENOSCOPY (EGD) WITH PROPOFOL ;  Surgeon: San Sandor GAILS, DO;  Location: [facility];  Service: Gastroenterology;  Laterality: N/A;   ESOPHAGUS SURGERY     open heart surgery  2013   thoracic surgery per pt   POLYPECTOMY  04/15/2022   Procedure: POLYPECTOMY;  Surgeon: San Sandor GAILS, DO;  Location: [facility];  Service: Gastroenterology;;    Current Outpatient Medications  Medication  Sig Dispense Refill   acetaminophen  (TYLENOL ) 650 MG CR tablet Take 650 mg by mouth every 8 (eight) hours as needed for pain.     amitriptyline  (ELAVIL ) 25 MG tablet Take 1 tablet (25 mg total) by mouth at bedtime. 30 tablet 1   caffeine 200 MG TABS tablet Take 200 mg by mouth.     dexlansoprazole  (DEXILANT ) 60 MG capsule Take 1 capsule (60 mg total) by mouth daily. 90 capsule 3   famotidine  (PEPCID ) 40 MG tablet Take 1 tablet (40 mg total) by mouth daily. 90 tablet 0   ibuprofen  (ADVIL ) 600 MG tablet Take 1 tablet (600 mg total) by mouth every 8 (eight) hours as needed. 30 tablet 0   ondansetron  (ZOFRAN ) 8 MG tablet Take 1 tablet (8 mg total) by mouth every 8 (eight) hours as needed for nausea or vomiting. 10 tablet 0   SUMAtriptan  (IMITREX ) 100 MG tablet Take 1 tablet (100 mg total) by mouth every 2 (two) hours as needed for migraine. Soraida Vickers repeat in 2 hours if headache persists or recurs. 20 tablet 3   No current facility-administered medications for this visit.    Allergies as of 08/22/2024   (No Known Allergies)    Family History  Problem Relation Age of Onset   Breast cancer Mother    Migraines Father  BRCA 1/2 Sister        brca+. had preventive dbl mastectomy   Heart attack Maternal Grandfather    Breast cancer Cousin    Colon cancer Neg Hx    Esophageal cancer Neg Hx     Review of Systems:    Constitutional: No weight loss, fever, chills, weakness or fatigue HEENT: Eyes: No change in vision               Ears, Nose, Throat:  No change in hearing or congestion Skin: No rash or itching Cardiovascular: No chest pain, chest pressure or palpitations   Respiratory: No SOB or cough Gastrointestinal: See HPI and otherwise negative Genitourinary: No dysuria or change in urinary frequency Neurological: No headache, dizziness or syncope Musculoskeletal: No new muscle or joint pain Hematologic: No bleeding or bruising Psychiatric: No history of depression or anxiety     Physical Exam:  Vital signs: BP 100/60   Pulse 87   Ht 4' 8 (1.422 m)   Wt 121 lb 4 oz (55 kg)   BMI 27.18 kg/m   Constitutional:   Pleasant  female appears to be in NAD, Well developed, Well nourished, alert and cooperative Eyes:   PEERL, EOMI. No icterus. Conjunctiva pink. Neck:  Supple Throat: Oral cavity and pharynx without inflammation, swelling or lesion.  Respiratory: Respirations even and unlabored. Lungs clear to auscultation bilaterally.   No wheezes, crackles, or rhonchi.  Cardiovascular: Normal S1, S2. Regular rate and rhythm. No peripheral edema, cyanosis or pallor.  Gastrointestinal:  Soft, nondistended, epigastric tenderness with palpation. No rebound or guarding. Normal bowel sounds. No appreciable masses or hepatomegaly. Rectal:  Not performed.  Msk:  Symmetrical without gross deformities. Without edema, no deformity or joint abnormality.  Neurologic:  Alert and  oriented x4;  grossly normal neurologically.  Skin:   Dry and intact without significant lesions or rashes.  RELEVANT LABS AND IMAGING: CBC    Latest Ref Rng & Units 07/12/2024    3:57 PM 12/31/2021   11:05 AM 01/29/2021   10:44 AM  CBC  WBC 3.4 - 10.8 x10E3/uL 5.8  4.0  6.8   Hemoglobin 11.1 - 15.9 g/dL 87.2  9.1  87.7   Hematocrit 34.0 - 46.6 % 39.6  29.1  35.8   Platelets 150 - 450 x10E3/uL 169  191  155      CMP     Latest Ref Rng & Units 07/12/2024    3:57 PM 12/31/2021   11:05 AM 03/04/2021   10:53 AM  CMP  Glucose 70 - 99 mg/dL 83  85  85   BUN 6 - 24 mg/dL 17  12  12    Creatinine 0.57 - 1.00 mg/dL 9.33  9.33  9.39   Sodium 134 - 144 mmol/L 142  142  138   Potassium 3.5 - 5.2 mmol/L 4.5  3.9  4.0   Chloride 96 - 106 mmol/L 103  106  103   CO2 20 - 29 mmol/L 25  24  21    Calcium 8.7 - 10.2 mg/dL 9.5  8.3  8.4   Total Protein 6.0 - 8.5 g/dL 6.7     Total Bilirubin 0.0 - 1.2 mg/dL 0.7     Alkaline Phos 41 - 116 IU/L 73     AST 0 - 40 IU/L 10     ALT 0 - 32 IU/L 7        Lab Results   Component Value Date   TSH 2.760 07/12/2024  01/23/2014: Graded exercise test: Nondiagnostic due to inability to achieve 85% of maximum predicted heart rate. Negative exercise-induced arrhythmia, angina. Appropriate blood pressure response. Poor cardiovascular fitness level - 08/18/2015: TTE: EF 60%. Normal     Assessment: Encounter Diagnoses  Name Primary?   Gastroesophageal reflux disease with esophagitis without hemorrhage Yes   Esophageal dysphagia    Oropharyngeal dysphagia    Dry cough    Nausea without vomiting    Abdominal pain, epigastric    Altered bowel habits      48 year old female patient with history of GERD with esophagitis found on EGD 8/23 and patient was placed on PPI therapy which she reports she since then has stopped because it doesn't work. She has tried and failed Omeprazole, Pantoprazole , or Esomeprazole. She has been on dual therapy no improvement. Presents with epigastric pain, pyrosis, nausea, and dysphagia with solids and liquids. Will go ahead and order MBSS to rule out motility disorder in addition to ordering upper GI endoscopy to evaluate and r/o stricture, esophagitis, and/or Barret's. Will start patient on Dexilant  60 mg and famotidine  at bedtime. Reinforced  GERD diet.  For the intermittent constipation we discussed increasing her overall fiber intake along with drinking plenty of fluids.  If this is not effective patient can take over-the-counter MiraLAX as needed.  Patient up-to-date on colonoscopy 8/23 with 1 TA, recall 7 years.   Plan: -Recommend GERD diet, no late meals -start Dexilant  60 mg po daily -start famotidine  40 mg at bedtime - Recommend GERD diet , no late meals  -Order MBSS  -Schedule with possible dilatation in hospital with Dr. San.The risks and benefits of EGD with possible biopsies and esophageal dilation were discussed with the patient who agrees to proceed.Plan to perform at [facility] Endoscopy unit with fluoroscopy  available. Difficult intubation. -recommend high fiber diet, drink plenty of fluids -start fiber supplement Citrucel or Benefiber po daily -Can use OTC miralax po daily if no improvement - Colonoscopy recall 04/2029  Thank you for the courtesy of this consult. Please call me with any questions or concerns.   Montray Kliebert, FNP-C [facility] 08/22/2024, 12:43 PM  Cc: Dottie Norleen PHEBE PONCE, MD

## 2024-08-22 NOTE — H&P (View-Only) (Signed)
 Chief Complaint: follow-up GERD Primary GI Doctor:Dr. San  HPI:  [name] with past medical history of GERD, depression, GAD, anxiety, insomnia, migraines, hypothyroidism, thrombocytopenia, who presents for a evaluation of worsening GERD.    GI history:  She reports having a longstanding history of IDA.  Dates back to at least 2013 and reports having an evaluation while living in [place], to include EGD (normal), colonoscopy (normal), and VCE (small bowel ulcer, managed medically).  Previously took OTC iron supplements, complicated by diarrhea so she stopped taking.    Additionally, longstanding history of dysphagia.  Reports having several EGDs with dilations over the years, with last esophageal balloon dilatation in 2014 in [place].  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  No records available for review.  Now with intermittent solid food dysphagia, but no history of food impactions.  Overall, symptoms not too bothersome now.  -11/22/2018: CT chest: Right-sided aortic arch.  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  This is presumably secondary to repair of symptomatic vascular ring in the past.  Right subclavian and right common carotid arteries have separate origins of the aortic arch and are patent - 04/10/2021: Barium esophagram: Mild narrowing and transient hold-up of contrast within the esophagus is surrounded by the right aortic arch, trachea, and conduit graft.  No hiatal hernia, no spontaneous reflux, the reflux present with water siphon test.  Barium tablet passed without difficulty.  Separately, longstanding history of GERD, with index symptoms of heartburn, regurgitation, waterbrash.  Can have nocturnal choking and coughing.  She is prescribed Protonix  40 mg/day, but  takes prn. Continues to mainly have nocturnal breakthrough reflux symptoms   Last colonoscopy was 2013 that normal/no polyps per patient.   No previous GI records available for review. She does not recall the name of her previous gastroenterologist or CT surgeon in [place].   04/15/22 EGD: LA Grade D reflux esophagitis with bleeding. Biopsied.  Normal gastric fundus, gastric body, incisura and antrum. Gastric stenosis was found at the pylorus. Dilated. Biopsied. Normal duodenal bulb, first portion of the duodenum and second portion of the duodenum.  04/15/22 colonoscopy: One 4 mm polyp in the ascending colon, removed with a cold snare. Resected and retrieved. The examined portion of the ileum was normal. Recall 7 years.  Interval History Patient last seen in GI office on 01/11/22 by Dr. San for Iron deficiency anemia, GERD, dysphagia.  Patient presents for evaluation of GERD and constipation. Accompanied by friend.   Patient presents with worsening reflux over the course of the last year or more. She reports having epigastric and pyrosis on daily basis. She will have intermittent nausea with dry heaves after eating certain foods. Patient likes to eat a lot italian food, pizza, and Mexican. Patient also has nonproductive cough.  Patient reports having esophageal spasms that are painful.  Patient will wake up in the middle of the night with epigastric pain which effects her sleep. Can have nocturnal choking and coughing.  She is currently taking OTC Tums and goes through whole bottle in few days She has tried Nexium, Omeprazole, Lansoprazole,and Pantoprazole  40 mg without much help. She is taking ondansetron  for nausea prn.  She has esophageal dysphagia with solids and liquids (carbonated drinks and wine).  Patient reports taking OTC ibuprofen  alternating with tylenol  prn  for back pain.   She complains of having issues with constipation about once a week where she has small rock shaped stools.  No rectal bleeding.   She reports she has had a lot of issues with anxiety and depression, currently looking for counselor for behavioral therapy.   Patient's family history includes:no colon CA, no esophageal CA, father with GERD  Wt Readings from Last 3 Encounters:  08/22/24 121 lb 4 oz (55 kg)  07/24/24 123 lb 12.8 oz (56.2 kg)  07/12/24 122 lb 8 oz (55.6 kg)    Past Medical History:  Diagnosis Date   Anxiety    Chronic insomnia    GERD (gastroesophageal reflux disease)    Clinically severe and known to [facility], with GI f/u advised   Hypothyroidism 01/29/2021   Migraine    Known to Neurology   Muscle spasm    Ulcer (traumatic) of oral mucosa     Past Surgical History:  Procedure Laterality Date   BALLOON DILATION N/A 04/15/2022   Procedure: BALLOON DILATION;  Surgeon: San Sandor GAILS, DO;  Location: [facility];  Service: Gastroenterology;  Laterality: N/A;   BIOPSY  04/15/2022   Procedure: BIOPSY;  Surgeon: San Sandor GAILS, DO;  Location: [facility];  Service: Gastroenterology;;   BREAST BIOPSY Bilateral 03/13/2021   BREAST EXCISIONAL BIOPSY Right 09/06/2012   (-)   CHOLECYSTECTOMY     COLONOSCOPY  2013   [facility]   COLONOSCOPY WITH PROPOFOL  N/A 04/15/2022   Procedure: COLONOSCOPY WITH PROPOFOL ;  Surgeon: San Sandor GAILS, DO;  Location: [facility];  Service: Gastroenterology;  Laterality: N/A;   ESOPHAGOGASTRODUODENOSCOPY  2013   [facility]   ESOPHAGOGASTRODUODENOSCOPY (EGD) WITH PROPOFOL  N/A 04/15/2022   Procedure: ESOPHAGOGASTRODUODENOSCOPY (EGD) WITH PROPOFOL ;  Surgeon: San Sandor GAILS, DO;  Location: [facility];  Service: Gastroenterology;  Laterality: N/A;   ESOPHAGUS SURGERY     open heart surgery  2013   thoracic surgery per pt   POLYPECTOMY  04/15/2022   Procedure: POLYPECTOMY;  Surgeon: San Sandor GAILS, DO;  Location: [facility];  Service: Gastroenterology;;    Current Outpatient Medications  Medication  Sig Dispense Refill   acetaminophen  (TYLENOL ) 650 MG CR tablet Take 650 mg by mouth every 8 (eight) hours as needed for pain.     amitriptyline  (ELAVIL ) 25 MG tablet Take 1 tablet (25 mg total) by mouth at bedtime. 30 tablet 1   caffeine 200 MG TABS tablet Take 200 mg by mouth.     dexlansoprazole  (DEXILANT ) 60 MG capsule Take 1 capsule (60 mg total) by mouth daily. 90 capsule 3   famotidine  (PEPCID ) 40 MG tablet Take 1 tablet (40 mg total) by mouth daily. 90 tablet 0   ibuprofen  (ADVIL ) 600 MG tablet Take 1 tablet (600 mg total) by mouth every 8 (eight) hours as needed. 30 tablet 0   ondansetron  (ZOFRAN ) 8 MG tablet Take 1 tablet (8 mg total) by mouth every 8 (eight) hours as needed for nausea or vomiting. 10 tablet 0   SUMAtriptan  (IMITREX ) 100 MG tablet Take 1 tablet (100 mg total) by mouth every 2 (two) hours as needed for migraine. Soraida Vickers repeat in 2 hours if headache persists or recurs. 20 tablet 3   No current facility-administered medications for this visit.    Allergies as of 08/22/2024   (No Known Allergies)    Family History  Problem Relation Age of Onset   Breast cancer Mother    Migraines Father  BRCA 1/2 Sister        brca+. had preventive dbl mastectomy   Heart attack Maternal Grandfather    Breast cancer Cousin    Colon cancer Neg Hx    Esophageal cancer Neg Hx     Review of Systems:    Constitutional: No weight loss, fever, chills, weakness or fatigue HEENT: Eyes: No change in vision               Ears, Nose, Throat:  No change in hearing or congestion Skin: No rash or itching Cardiovascular: No chest pain, chest pressure or palpitations   Respiratory: No SOB or cough Gastrointestinal: See HPI and otherwise negative Genitourinary: No dysuria or change in urinary frequency Neurological: No headache, dizziness or syncope Musculoskeletal: No new muscle or joint pain Hematologic: No bleeding or bruising Psychiatric: No history of depression or anxiety     Physical Exam:  Vital signs: BP 100/60   Pulse 87   Ht 4' 8 (1.422 m)   Wt 121 lb 4 oz (55 kg)   BMI 27.18 kg/m   Constitutional:   Pleasant  female appears to be in NAD, Well developed, Well nourished, alert and cooperative Eyes:   PEERL, EOMI. No icterus. Conjunctiva pink. Neck:  Supple Throat: Oral cavity and pharynx without inflammation, swelling or lesion.  Respiratory: Respirations even and unlabored. Lungs clear to auscultation bilaterally.   No wheezes, crackles, or rhonchi.  Cardiovascular: Normal S1, S2. Regular rate and rhythm. No peripheral edema, cyanosis or pallor.  Gastrointestinal:  Soft, nondistended, epigastric tenderness with palpation. No rebound or guarding. Normal bowel sounds. No appreciable masses or hepatomegaly. Rectal:  Not performed.  Msk:  Symmetrical without gross deformities. Without edema, no deformity or joint abnormality.  Neurologic:  Alert and  oriented x4;  grossly normal neurologically.  Skin:   Dry and intact without significant lesions or rashes.  RELEVANT LABS AND IMAGING: CBC    Latest Ref Rng & Units 07/12/2024    3:57 PM 12/31/2021   11:05 AM 01/29/2021   10:44 AM  CBC  WBC 3.4 - 10.8 x10E3/uL 5.8  4.0  6.8   Hemoglobin 11.1 - 15.9 g/dL 87.2  9.1  87.7   Hematocrit 34.0 - 46.6 % 39.6  29.1  35.8   Platelets 150 - 450 x10E3/uL 169  191  155      CMP     Latest Ref Rng & Units 07/12/2024    3:57 PM 12/31/2021   11:05 AM 03/04/2021   10:53 AM  CMP  Glucose 70 - 99 mg/dL 83  85  85   BUN 6 - 24 mg/dL 17  12  12    Creatinine 0.57 - 1.00 mg/dL 9.33  9.33  9.39   Sodium 134 - 144 mmol/L 142  142  138   Potassium 3.5 - 5.2 mmol/L 4.5  3.9  4.0   Chloride 96 - 106 mmol/L 103  106  103   CO2 20 - 29 mmol/L 25  24  21    Calcium 8.7 - 10.2 mg/dL 9.5  8.3  8.4   Total Protein 6.0 - 8.5 g/dL 6.7     Total Bilirubin 0.0 - 1.2 mg/dL 0.7     Alkaline Phos 41 - 116 IU/L 73     AST 0 - 40 IU/L 10     ALT 0 - 32 IU/L 7        Lab Results   Component Value Date   TSH 2.760 07/12/2024  01/23/2014: Graded exercise test: Nondiagnostic due to inability to achieve 85% of maximum predicted heart rate. Negative exercise-induced arrhythmia, angina. Appropriate blood pressure response. Poor cardiovascular fitness level - 08/18/2015: TTE: EF 60%. Normal     Assessment: Encounter Diagnoses  Name Primary?   Gastroesophageal reflux disease with esophagitis without hemorrhage Yes   Esophageal dysphagia    Oropharyngeal dysphagia    Dry cough    Nausea without vomiting    Abdominal pain, epigastric    Altered bowel habits      48 year old female patient with history of GERD with esophagitis found on EGD 8/23 and patient was placed on PPI therapy which she reports she since then has stopped because it doesn't work. She has tried and failed Omeprazole, Pantoprazole , or Esomeprazole. She has been on dual therapy no improvement. Presents with epigastric pain, pyrosis, nausea, and dysphagia with solids and liquids. Will go ahead and order MBSS to rule out motility disorder in addition to ordering upper GI endoscopy to evaluate and r/o stricture, esophagitis, and/or Barret's. Will start patient on Dexilant  60 mg and famotidine  at bedtime. Reinforced  GERD diet.  For the intermittent constipation we discussed increasing her overall fiber intake along with drinking plenty of fluids.  If this is not effective patient can take over-the-counter MiraLAX as needed.  Patient up-to-date on colonoscopy 8/23 with 1 TA, recall 7 years.   Plan: -Recommend GERD diet, no late meals -start Dexilant  60 mg po daily -start famotidine  40 mg at bedtime - Recommend GERD diet , no late meals  -Order MBSS  -Schedule with possible dilatation in hospital with Dr. San.The risks and benefits of EGD with possible biopsies and esophageal dilation were discussed with the patient who agrees to proceed.Plan to perform at [facility] Endoscopy unit with fluoroscopy  available. Difficult intubation. -recommend high fiber diet, drink plenty of fluids -start fiber supplement Citrucel or Benefiber po daily -Can use OTC miralax po daily if no improvement - Colonoscopy recall 04/2029  Thank you for the courtesy of this consult. Please call me with any questions or concerns.   Emily Kliebert, FNP-C [facility] 08/22/2024, 12:43 PM  Cc: Dottie Norleen PHEBE PONCE, MD

## 2024-08-22 NOTE — Telephone Encounter (Signed)
 Noted

## 2024-08-22 NOTE — Telephone Encounter (Signed)
 Pharmacy Patient Advocate Encounter   Received notification from CoverMyMeds that prior authorization for Dexlansoprazole  60MG  dr capsules is required/requested.   Insurance verification completed.   The patient is insured through [facility] MEDICAID.   Per test claim: PA required; PA submitted to above mentioned insurance via Latent Key/confirmation #/EOC [id number] Status is pending

## 2024-08-22 NOTE — Patient Instructions (Signed)
 GERD Recommend GERD diet, no late meals Start Dexilant  60 mg po daily Start famotidine  40 mg at bedtime   Constipation Recommend plenty of fluids Recommend high fiber diet Recommend starting Citrucel or Benefiber po daily If no improvement can try otc Miralax po daily   You have been scheduled for a Barium Esophogram at [facility] (Entrance A of the hospital) on 09/24/24 at 11:00am. Please arrive 30 minutes prior to your appointment for registration. Make certain not to have anything to eat or drink 3 hours prior to your test. If you need to reschedule for any reason, please contact radiology at [phone] to do so. __________________________________________________________________ A barium swallow is an examination that concentrates on views of the esophagus. This tends to be a double contrast exam (barium and two liquids which, when combined, create a gas to distend the wall of the oesophagus) or single contrast (non-ionic iodine based). The study is usually tailored to your symptoms so a good history is essential. Attention is paid during the study to the form, structure and configuration of the esophagus, looking for functional disorders (such as aspiration, dysphagia, achalasia, motility and reflux) EXAMINATION You may be asked to change into a gown, depending on the type of swallow being performed. A radiologist and radiographer will perform the procedure. The radiologist will advise you of the type of contrast selected for your procedure and direct you during the exam. You will be asked to stand, sit or lie in several different positions and to hold a small amount of fluid in your mouth before being asked to swallow while the imaging is performed .In some instances you may be asked to swallow barium coated marshmallows to assess the motility of a solid food bolus. The exam can be recorded as a digital or video fluoroscopy procedure. POST PROCEDURE It will take 1-2 days  for the barium to pass through your system. To facilitate this, it is important, unless otherwise directed, to increase your fluids for the next 24-48hrs and to resume your normal diet.  This test typically takes about 30 minutes to perform. __________________________________________________________________________________  Rosine have been scheduled for an endoscopy. Please follow written instructions given to you at your visit today.  If you use inhalers (even only as needed), please bring them with you on the day of your procedure.  If you take any of the following medications, they will need to be adjusted prior to your procedure:   DO NOT TAKE 7 DAYS PRIOR TO TEST- Trulicity (dulaglutide) Ozempic, Wegovy (semaglutide) Mounjaro, Zepbound (tirzepatide) Bydureon Bcise (exanatide extended release)  DO NOT TAKE 1 DAY PRIOR TO YOUR TEST Rybelsus (semaglutide) Adlyxin (lixisenatide) Victoza (liraglutide) Byetta (exanatide) ___________________________________________________________________________  Due to recent changes in healthcare laws, you may see the results of your imaging and laboratory studies on MyChart before your provider has had a chance to review them.  We understand that in some cases there may be results that are confusing or concerning to you. Not all laboratory results come back in the same time frame and the provider may be waiting for multiple results in order to interpret others.  Please give us  48 hours in order for your provider to thoroughly review all the results before contacting the office for clarification of your results.   _______________________________________________________  If your blood pressure at your visit was 140/90 or greater, please contact your primary care physician to follow up on this.  _______________________________________________________  If you are age 30 or older, your body mass  index should be between 23-30. Your Body mass index is 27.18  kg/m. If this is out of the aforementioned range listed, please consider follow up with your Primary Care Provider.  If you are age 25 or younger, your body mass index should be between 19-25. Your Body mass index is 27.18 kg/m. If this is out of the aformentioned range listed, please consider follow up with your Primary Care Provider.   ________________________________________________________  The [facility] providers would like to encourage you to use MYCHART to communicate with providers for non-urgent requests or questions.  Due to long hold times on the telephone, sending your provider a message by [facility] may be a faster and more efficient way to get a response.  Please allow 48 business hours for a response.  Please remember that this is for non-urgent requests.  _______________________________________________________  [facility] is using a team-based approach to care.  Your team is made up of your doctor and two to three APPS. Our APPS (Nurse Practitioners and Physician Assistants) work with your physician to ensure care continuity for you. They are fully qualified to address your health concerns and develop a treatment plan. They communicate directly with your gastroenterologist to care for you. Seeing the Advanced Practice Practitioners on your physician's team can help you by facilitating care more promptly, often allowing for earlier appointments, access to diagnostic testing, procedures, and other specialty referrals.   Thank you for trusting me with your gastrointestinal care. Deanna May, FNP-C

## 2024-08-23 ENCOUNTER — Other Ambulatory Visit: Payer: MEDICAID

## 2024-08-23 NOTE — Progress Notes (Signed)
 Agree with the assessment and plan as outlined by [facility], FNP-C.  Carlitos Bottino, DO, [facility]

## 2024-08-24 ENCOUNTER — Telehealth: Payer: Self-pay | Admitting: Podiatry

## 2024-08-24 NOTE — Telephone Encounter (Signed)
 Dr. Gershon - spoke to pt and family member 12/17 and they want to know if MRI can be performed at the hospital instead of at [facility].  I called today to let them we were still reviewing.  I will call them back on 12/30

## 2024-08-29 ENCOUNTER — Telehealth: Payer: Self-pay

## 2024-08-29 DIAGNOSIS — M205X1 Other deformities of toe(s) (acquired), right foot: Secondary | ICD-10-CM

## 2024-08-29 DIAGNOSIS — R2681 Unsteadiness on feet: Secondary | ICD-10-CM

## 2024-08-29 NOTE — Telephone Encounter (Signed)
 Insurance company is requesting additional information that stated dates and duration of treatment (active conservative care such as physical therapy, chiropractic osteopathic manipulative treatment or home exercise program) showing 4 weeks worth within the past 6 months. Also notes that say surgery is planned and scheduled.  You can do a peer to peer by calling [phone] and referencing tracking number

## 2024-08-31 ENCOUNTER — Encounter (HOSPITAL_COMMUNITY): Payer: Self-pay | Admitting: Gastroenterology

## 2024-09-03 ENCOUNTER — Telehealth: Payer: Self-pay | Admitting: Gastroenterology

## 2024-09-03 NOTE — Telephone Encounter (Addendum)
 Procedure:Endoscopy Procedure date: 09/13/24 Procedure location: [facility] Arrival Time: 9:00 am Spoke with the patient Y/N: Yes Any prep concerns? No  Has the patient obtained the prep from the pharmacy ? No prep needed Do you have a care partner and transportation: Yes Any additional concerns? No

## 2024-09-10 ENCOUNTER — Ambulatory Visit: Payer: MEDICAID

## 2024-09-10 DIAGNOSIS — Z1231 Encounter for screening mammogram for malignant neoplasm of breast: Secondary | ICD-10-CM

## 2024-09-11 ENCOUNTER — Other Ambulatory Visit: Payer: MEDICAID

## 2024-09-13 ENCOUNTER — Ambulatory Visit (HOSPITAL_COMMUNITY): Payer: MEDICAID

## 2024-09-13 ENCOUNTER — Other Ambulatory Visit: Payer: Self-pay

## 2024-09-13 ENCOUNTER — Encounter (HOSPITAL_COMMUNITY)
Admission: RE | Disposition: A | Discharge status: Home / Self Care | Payer: Self-pay | Source: Home / Self Care | Attending: Gastroenterology

## 2024-09-13 ENCOUNTER — Encounter (HOSPITAL_COMMUNITY): Payer: Self-pay | Admitting: Gastroenterology

## 2024-09-13 ENCOUNTER — Ambulatory Visit (HOSPITAL_COMMUNITY)
Admission: RE | Admit: 2024-09-13 | Discharge: 2024-09-13 | Disposition: A | Discharge status: Home / Self Care | Payer: MEDICAID | Attending: Gastroenterology | Admitting: Gastroenterology

## 2024-09-13 DIAGNOSIS — K2100 Gastro-esophageal reflux disease with esophagitis, without bleeding: Secondary | ICD-10-CM

## 2024-09-13 DIAGNOSIS — K449 Diaphragmatic hernia without obstruction or gangrene: Secondary | ICD-10-CM | POA: Diagnosis not present

## 2024-09-13 DIAGNOSIS — D509 Iron deficiency anemia, unspecified: Secondary | ICD-10-CM | POA: Insufficient documentation

## 2024-09-13 DIAGNOSIS — E039 Hypothyroidism, unspecified: Secondary | ICD-10-CM | POA: Diagnosis not present

## 2024-09-13 DIAGNOSIS — R1319 Other dysphagia: Secondary | ICD-10-CM | POA: Diagnosis not present

## 2024-09-13 DIAGNOSIS — F411 Generalized anxiety disorder: Secondary | ICD-10-CM | POA: Insufficient documentation

## 2024-09-13 DIAGNOSIS — F32A Depression, unspecified: Secondary | ICD-10-CM | POA: Insufficient documentation

## 2024-09-13 DIAGNOSIS — K311 Adult hypertrophic pyloric stenosis: Secondary | ICD-10-CM | POA: Diagnosis not present

## 2024-09-13 DIAGNOSIS — G43909 Migraine, unspecified, not intractable, without status migrainosus: Secondary | ICD-10-CM | POA: Insufficient documentation

## 2024-09-13 DIAGNOSIS — K219 Gastro-esophageal reflux disease without esophagitis: Secondary | ICD-10-CM | POA: Insufficient documentation

## 2024-09-13 DIAGNOSIS — R110 Nausea: Secondary | ICD-10-CM

## 2024-09-13 DIAGNOSIS — K5900 Constipation, unspecified: Secondary | ICD-10-CM | POA: Insufficient documentation

## 2024-09-13 DIAGNOSIS — Z860100 Personal history of colon polyps, unspecified: Secondary | ICD-10-CM | POA: Insufficient documentation

## 2024-09-13 HISTORY — DX: Other complications of anesthesia, initial encounter: T88.59XA

## 2024-09-13 HISTORY — PX: ESOPHAGOGASTRODUODENOSCOPY: SHX5428

## 2024-09-13 HISTORY — DX: Failed or difficult intubation, initial encounter: T88.4XXA

## 2024-09-13 HISTORY — PX: BALLOON DILATION: SHX5330

## 2024-09-13 HISTORY — PX: SUBMUCOSAL INJECTION: SHX5543

## 2024-09-13 SURGERY — EGD (ESOPHAGOGASTRODUODENOSCOPY)
Anesthesia: Monitor Anesthesia Care

## 2024-09-13 MED ORDER — PROPOFOL 500 MG/50ML IV EMUL
INTRAVENOUS | Status: DC | PRN
  Administered 2024-09-13: 125 ug/kg/min via INTRAVENOUS

## 2024-09-13 MED ORDER — SODIUM CHLORIDE 0.9 % IV SOLN: INTRAVENOUS | Status: DC

## 2024-09-13 MED ORDER — TRIAMCINOLONE ACETONIDE 40 MG/ML IJ SUSP
INTRAMUSCULAR | Status: DC | PRN
  Administered 2024-09-13: 40 mg via INTRAMUSCULAR

## 2024-09-13 MED ORDER — LIDOCAINE 2% (20 MG/ML) 5 ML SYRINGE
INTRAMUSCULAR | Status: DC | PRN
  Administered 2024-09-13: 60 mg via INTRAVENOUS

## 2024-09-13 MED ORDER — LABETALOL HCL 5 MG/ML IV SOLN
5.0000 mg | Freq: Once | INTRAVENOUS | Status: AC
  Administered 2024-09-13: 5 mg via INTRAVENOUS

## 2024-09-13 MED ORDER — TRIAMCINOLONE ACETONIDE 40 MG/ML IJ SUSP
INTRAMUSCULAR | Status: AC
  Filled 2024-09-13: qty 1

## 2024-09-13 MED ORDER — SODIUM CHLORIDE (PF) 0.9 % IJ SOLN
INTRAMUSCULAR | Status: AC
  Filled 2024-09-13: qty 10

## 2024-09-13 MED ORDER — LABETALOL HCL 5 MG/ML IV SOLN
INTRAVENOUS | Status: AC
  Filled 2024-09-13: qty 4

## 2024-09-13 MED ORDER — PROPOFOL 10 MG/ML IV BOLUS
INTRAVENOUS | Status: DC | PRN
  Administered 2024-09-13 (×5): 20 mg via INTRAVENOUS

## 2024-09-13 MED ORDER — PROPOFOL 500 MG/50ML IV EMUL
INTRAVENOUS | Status: AC
  Filled 2024-09-13: qty 50

## 2024-09-13 MED ORDER — PROPOFOL 500 MG/50ML IV EMUL
INTRAVENOUS | Status: AC
  Filled 2024-09-13: qty 100

## 2024-09-13 MED ORDER — PHENYLEPHRINE 80 MCG/ML (10ML) SYRINGE FOR IV PUSH (FOR BLOOD PRESSURE SUPPORT)
PREFILLED_SYRINGE | INTRAVENOUS | Status: DC | PRN
  Administered 2024-09-13: 80 ug via INTRAVENOUS
  Administered 2024-09-13 (×3): 160 ug via INTRAVENOUS

## 2024-09-13 NOTE — Interval H&P Note (Signed)
 History and Physical Interval Note:  No significant changes in clinical history since last OV on 08/22/2024.  Was started on Dexilant  at that time along with famotidine  at bedtime.  Not clear if she is taking the Dexilant  daily or twice daily, but has noticed overall symptom improvement.  Scheduled for MBS later this month.  Plan to proceed with EGD today for diagnostic and therapeutic intent.  Plan for endoscopic dilation and/or biopsies as appropriate.  Procedure scheduled at [facility] due to elevated periprocedural risks due to history of difficult intubation.  09/13/2024 9:26 AM  Emily Norton  has presented today for surgery, with the diagnosis of GERD K21.00, Esophageal Dysphagia R13.19, Nausea without vomiting R11.0.  The various methods of treatment have been discussed with the patient and family. After consideration of risks, benefits and other options for treatment, the patient has consented to  Procedures: EGD (ESOPHAGOGASTRODUODENOSCOPY) (N/A) as a surgical intervention.  The patient's history has been reviewed, patient examined, no change in status, stable for surgery.  I have reviewed the patient's chart and labs.  Questions were answered to the patient's satisfaction.     Sandor GAILS Franceen Erisman

## 2024-09-13 NOTE — Anesthesia Preprocedure Evaluation (Addendum)
"                                    Anesthesia Evaluation  Patient identified by MRN, date of birth, ID band Patient awake    Reviewed: Allergy & Precautions, NPO status , Patient's Chart, lab work & pertinent test results  History of Anesthesia Complications Negative for: history of anesthetic complications  Airway Mallampati: II  TM Distance: >3 FB Neck ROM: Full    Dental  (+) Teeth Intact, Dental Advisory Given   Pulmonary    breath sounds clear to auscultation       Cardiovascular  Rhythm:Regular Rate:Normal   TTE (2016 - CareEverywhere): Normal biventricular function.   CT chest (2020): Right-sided aortic arch.  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     Neuro/Psych  Headaches  Anxiety        GI/Hepatic ,GERD  Medicated and Controlled,,  Endo/Other  Hypothyroidism    Renal/GU      Musculoskeletal   Abdominal   Peds  Hematology  (+) Blood dyscrasia, anemia   Anesthesia Other Findings   Reproductive/Obstetrics                              Anesthesia Physical Anesthesia Plan  ASA: 2  Anesthesia Plan: MAC   Post-op Pain Management:    Induction: Intravenous  PONV Risk Score and Plan: 2 and Propofol  infusion and Treatment may vary due to age or medical condition  Airway Management Planned: Natural Airway and Simple Face Mask  Additional Equipment: None  Intra-op Plan:   Post-operative Plan:   Informed Consent: I have reviewed the patients History and Physical, chart, labs and discussed the procedure including the risks, benefits and alternatives for the proposed anesthesia with the patient or authorized representative who has indicated his/her understanding and acceptance.     Dental advisory given  Plan Discussed with: CRNA  Anesthesia Plan Comments:          Anesthesia Quick  Evaluation  "

## 2024-09-13 NOTE — Discharge Instructions (Signed)

## 2024-09-13 NOTE — Transfer of Care (Signed)
 Immediate Anesthesia Transfer of Care Note  Patient: Emily Norton  Procedure(s) Performed: EGD (ESOPHAGOGASTRODUODENOSCOPY) BALLOON DILATION INJECTION, SUBMUCOSAL  Patient Location: PACU and Endoscopy Unit  Anesthesia Type:MAC  Level of Consciousness: awake, alert , and patient cooperative  Airway & Oxygen Therapy: Patient Spontanous Breathing  Post-op Assessment: Report given to RN and Post -op Vital signs reviewed and stable  Post vital signs: Reviewed and stable  Last Vitals:  Vitals Value Taken Time  BP 123/76 09/13/24 12:14  Temp    Pulse 83 09/13/24 12:16  Resp 15 09/13/24 12:16  SpO2 100 % 09/13/24 12:16  Vitals shown include unfiled device data.  Last Pain:  Vitals:   09/13/24 0949  TempSrc: Temporal  PainSc: 0-No pain         Complications: No notable events documented.

## 2024-09-13 NOTE — Anesthesia Procedure Notes (Signed)
 Procedure Name: MAC Date/Time: 09/13/2024 11:12 AM  Performed by: Dasie Nena PARAS, CRNAPre-anesthesia Checklist: Patient identified, Emergency Drugs available, Suction available, Patient being monitored and Timeout performed Oxygen Delivery Method: Simple face mask Preoxygenation: POM used. Placement Confirmation: positive ETCO2

## 2024-09-13 NOTE — Op Note (Signed)
 [facility] Patient Name: Emily Norton Procedure Date: 09/13/2024 MRN: [id number] Attending MD: Sandor Flatter , MD, [id number] Date of Birth: 10-20-1975 CSN: [id number] Age: 49 Admit Type: Outpatient Procedure:                Upper GI endoscopy Indications:              Dysphagia, Esophageal reflux, Follow-up of pyloric                            stenosis, For therapy of pyloric stenosis, Nausea Providers:                Sandor Flatter, MD, Jacquelyn Jaci Pierce, RN,                            Curtistine Bishop, Technician Referring MD:              Medicines:                Monitored Anesthesia Care Complications:            No immediate complications. Estimated Blood Loss:     Estimated blood loss was minimal. Procedure:                Pre-Anesthesia Assessment:                           - Prior to the procedure, a History and Physical                            was performed, and patient medications and                            allergies were reviewed. The patient's tolerance of                            previous anesthesia was also reviewed. The risks                            and benefits of the procedure and the sedation                            options and risks were discussed with the patient.                            All questions were answered, and informed consent                            was obtained. Prior Anticoagulants: The patient has                            taken no anticoagulant or antiplatelet agents. ASA                            Grade Assessment: II - A patient with mild systemic  disease. After reviewing the risks and benefits,                            the patient was deemed in satisfactory condition to                            undergo the procedure.                           After obtaining informed consent, the endoscope was                            passed under direct vision. Throughout the                             procedure, the patient's blood pressure, pulse, and                            oxygen saturations were monitored continuously. The                            [id number]-H190 ([id number]) Olympus endoscope was introduced                            through the mouth, and advanced to the second part                            of duodenum. The upper GI endoscopy was technically                            difficult and complex. The patient tolerated the                            procedure well. Scope In: Scope Out: Findings:      The upper third of the esophagus and middle third of the esophagus were       normal.      LA Grade C (one or more mucosal breaks continuous between tops of 2 or       more mucosal folds, less than 75% circumference) esophagitis with no       bleeding was found in the lower third of the esophagus.      A 2 cm hiatal hernia was present.      The gastric fundus, gastric body, incisura and gastric antrum were       normal.      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       Thnis actually appeared to be 2 successive stenoses at the pylorus.       Stenosis was not traversable with the standard endoscope. This was       exchanged for the ultraslim scope and stenosis was then traversed with       examination of the duodenum. A guidewire was placed into the duodenum       under direct visualization. This guidewire did not allow for back       loading of the dilation balloons, so this was  then exchanged for a       Jagwire, again placed directly into the small bowel. The standard       endoscope was then backloaded over the [place] and just dilation balloon       was advanced over the [place] and through the pylorus. This was inflated       to 6 mm with appropriate mucosal blanching. This was then inflated to 7       mm with mucosal blanching and mild mucosal disruption, consistent with       successful dilation. The stenosis was  still not traversable with the       standard endoscope. Area was successfully injected with 1 mL of       triamcinolone  (40 mg/mL) for drug delivery. Estimated blood loss was       minimal.      The examined duodenum was normal. Impression:               - Normal upper third of esophagus and middle third                            of esophagus.                           - LA Grade C reflux esophagitis with no bleeding.                           - 2 cm hiatal hernia.                           - Normal gastric fundus, gastric body, incisura and                            antrum.                           - Gastric stenosis was found at the pylorus.                            Dilated with 7 mm TTS balloon then injected with                            triamcinolone .                           - Normal examined duodenum.                           - No specimens collected. Moderate Sedation:      Not Applicable - Patient had care per Anesthesia. Recommendation:           - Patient has a contact number available for                            emergencies. The signs and symptoms of potential                            delayed complications were discussed with the  patient. Return to normal activities tomorrow.                            Written discharge instructions were provided to the                            patient.                           - Full liquid diet today, then slowly advance as                            tolerated tomorrow.                           - Continue Dexilant  as recently started.                           - Add Pepcid  40 mg at nighttime.                           - Depending on response to serial dilations of the                            pyloric channel stenoses, may need to consider                            referral to one of the larger academic centers or                            surgical referral.                           -  Continue present medications.                           - Repeat upper endoscopy in 6 weeks for retreatment.                           - Return to GI office in 3 months or sooner as                            needed. Procedure Code(s):        --- Professional ---                           [phone], Esophagogastroduodenoscopy, flexible,                            transoral; with dilation of gastric/duodenal                            stricture(s) (eg, balloon, bougie)                           43236, 59, Esophagogastroduodenoscopy, flexible,  transoral; with directed submucosal injection(s),                            any substance Diagnosis Code(s):        --- Professional ---                           K21.00, Gastro-esophageal reflux disease with                            esophagitis, without bleeding                           K44.9, Diaphragmatic hernia without obstruction or                            gangrene                           K31.1, Adult hypertrophic pyloric stenosis                           R13.10, Dysphagia, unspecified                           R11.0, Nausea CPT copyright 2022 American Medical Association. All rights reserved. The codes documented in this report are preliminary and upon coder review may  be revised to meet current compliance requirements. Sandor Flatter, MD 09/13/2024 12:22:51 PM Number of Addenda: 0

## 2024-09-14 ENCOUNTER — Telehealth: Payer: Self-pay

## 2024-09-14 ENCOUNTER — Encounter (HOSPITAL_COMMUNITY): Payer: Self-pay | Admitting: Gastroenterology

## 2024-09-14 ENCOUNTER — Telehealth: Payer: Self-pay | Admitting: Podiatry

## 2024-09-14 NOTE — Telephone Encounter (Signed)
 13 pages of records faxed to [facility] for a consult with Dr Teresa.  Fax confirmation has been received.

## 2024-09-14 NOTE — Telephone Encounter (Signed)
-----   Message from [place], DO sent at 09/14/2024  9:29 AM EST ----- Can you please send a referral for this patient to Dr. Tanda at [facility] for evaluation of pyloric stricture and potential bypass surgery. Thanks ----- Message ----- From: Tanda Locus, MD Sent: 09/13/2024   5:51 PM EST To: Sandor San GAILS, DO  Can certainly evaluate her But will probably need upper gi/esophagram, perhaps manometry based on her what is bothering/symptoms and goals are.   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  I dilated this back in 2023, then she was essentially lost to follow-up for couple of years (did not have insurance).  Repeat EGD today and still with tight stricture.  I suspect she has gastric outlet obstruction which actually leads to her having reflux with severe esophagitis.  Thankfully no retained food in the stomach and able to get good visualization.  Could only traverse using the ultraslim scope, then I dilated it with a 7 mm balloon and injected with Kenalog .  She reports this had been done sometime in the past in [place] as well and she thought she had a perforation.  I have no records to corroborate that anywhere.  My plan as of now is for a repeat upper endoscopy in about 6 weeks and continue trying to dilate this stricture.  However, she is interested in surgery as a means for more definitive management.  Based on her history, I certainly did not promise anything, but told her I would reach out to you for a possible office evaluation and discussion of the pros/cons of surgery for this.  Thanks for  considering.   Vito

## 2024-09-14 NOTE — Telephone Encounter (Signed)
 Spoke to pt.  She has not started therapy.  Pt asked about status of MRI being performed at [facility].  Patient wants to speak to myself, Dr. Gershon and possibly Burman Getting about care they feel has not been given.

## 2024-09-16 NOTE — Anesthesia Postprocedure Evaluation (Signed)
"   Anesthesia Post Note  Patient: Emily Norton  Procedure(s) Performed: EGD (ESOPHAGOGASTRODUODENOSCOPY) BALLOON DILATION INJECTION, SUBMUCOSAL     Patient location during evaluation: Endoscopy Anesthesia Type: MAC Level of consciousness: awake Pain management: pain level controlled Vital Signs Assessment: post-procedure vital signs reviewed and stable Respiratory status: spontaneous breathing Cardiovascular status: blood pressure returned to baseline Postop Assessment: no apparent nausea or vomiting Anesthetic complications: no   No notable events documented.  Last Vitals:  Vitals:   09/13/24 1230 09/13/24 1240  BP: 131/89 137/60  Pulse: 78 78  Resp: 18 14  Temp:    SpO2: 100% 99%    Last Pain:  Vitals:   09/13/24 1240  TempSrc:   PainSc: 0-No pain                 Massie Mees T Colhoun      "

## 2024-09-19 ENCOUNTER — Ambulatory Visit: Payer: MEDICAID | Admitting: Gastroenterology

## 2024-09-20 ENCOUNTER — Other Ambulatory Visit (HOSPITAL_BASED_OUTPATIENT_CLINIC_OR_DEPARTMENT_OTHER): Payer: Self-pay | Admitting: Family Medicine

## 2024-09-20 ENCOUNTER — Inpatient Hospital Stay
Admission: RE | Admit: 2024-09-20 | Discharge: 2024-09-20 | Payer: MEDICAID | Attending: Obstetrics | Admitting: Obstetrics

## 2024-09-20 DIAGNOSIS — G43409 Hemiplegic migraine, not intractable, without status migrainosus: Secondary | ICD-10-CM

## 2024-09-20 DIAGNOSIS — Z1231 Encounter for screening mammogram for malignant neoplasm of breast: Secondary | ICD-10-CM

## 2024-09-21 ENCOUNTER — Ambulatory Visit: Payer: Self-pay

## 2024-09-21 ENCOUNTER — Other Ambulatory Visit (HOSPITAL_BASED_OUTPATIENT_CLINIC_OR_DEPARTMENT_OTHER): Payer: Self-pay | Admitting: Student

## 2024-09-21 ENCOUNTER — Encounter (HOSPITAL_BASED_OUTPATIENT_CLINIC_OR_DEPARTMENT_OTHER): Payer: Self-pay | Admitting: Family Medicine

## 2024-09-21 DIAGNOSIS — G43409 Hemiplegic migraine, not intractable, without status migrainosus: Secondary | ICD-10-CM

## 2024-09-21 DIAGNOSIS — R110 Nausea: Secondary | ICD-10-CM

## 2024-09-21 MED ORDER — ONDANSETRON HCL 8 MG PO TABS: 8.0000 mg | ORAL_TABLET | Freq: Three times a day (TID) | ORAL | 0 refills | Status: DC | PRN

## 2024-09-21 NOTE — Telephone Encounter (Signed)
 Pt's partner wanting to know if the pt can get higher dose of amitriptyline .   FYI Only or Action Required?: Action required by provider: clinical question for provider.  Patient was last seen in primary care on 07/24/2024 by Dottie Norleen PHEBE PONCE, MD.  Called Nurse Triage reporting Nausea, Headache, Cough, and Medication Refill.    Reason for Triage: Pt called in stating that she has been experiencing severe nausea and headaches. Pt also stated she has questions about her medications for nausea. Pt denied having any other symptoms. Warm transferred to nurse triage.   Reason for Disposition  [1] Prescription prescribed recently is not at pharmacy AND [2] triager has access to patient's EMR AND [3] prescription is recorded in the EMR  Answer Assessment - Initial Assessment Questions 1. DRUG NAME: What medicine do you need to have refilled?     Needing zofran  and amitriptyline , pt notified that zofran  has already been refilled. Partner of pt asking if the physician can prescribe a higher dose of amitriptyline , this RN stated that the provider can be sent a message and asked.    6. SYMPTOMS: Do you have any symptoms?     HA, nausea  Protocols used: Medication Refill and Renewal Call-A-AH

## 2024-09-24 ENCOUNTER — Other Ambulatory Visit: Payer: Self-pay

## 2024-09-24 ENCOUNTER — Other Ambulatory Visit (HOSPITAL_COMMUNITY)
Admission: RE | Admit: 2024-09-24 | Discharge: 2024-09-24 | Disposition: A | Payer: MEDICAID | Source: Ambulatory Visit | Attending: Obstetrics | Admitting: Obstetrics

## 2024-09-24 ENCOUNTER — Encounter: Payer: Self-pay | Admitting: Obstetrics

## 2024-09-24 ENCOUNTER — Ambulatory Visit (HOSPITAL_COMMUNITY)
Admission: RE | Admit: 2024-09-24 | Discharge: 2024-09-24 | Disposition: A | Discharge status: Home / Self Care | Payer: MEDICAID | Source: Ambulatory Visit | Attending: Family Medicine | Admitting: Family Medicine

## 2024-09-24 ENCOUNTER — Ambulatory Visit: Payer: Self-pay | Admitting: Gastroenterology

## 2024-09-24 ENCOUNTER — Other Ambulatory Visit (HOSPITAL_COMMUNITY): Payer: Self-pay | Admitting: Gastroenterology

## 2024-09-24 ENCOUNTER — Ambulatory Visit: Payer: MEDICAID | Admitting: Obstetrics

## 2024-09-24 ENCOUNTER — Encounter (HOSPITAL_COMMUNITY): Payer: MEDICAID

## 2024-09-24 VITALS — BP 121/82 | HR 80 | Ht <= 58 in | Wt 121.9 lb

## 2024-09-24 DIAGNOSIS — Z01419 Encounter for gynecological examination (general) (routine) without abnormal findings: Secondary | ICD-10-CM | POA: Diagnosis not present

## 2024-09-24 DIAGNOSIS — N951 Menopausal and female climacteric states: Secondary | ICD-10-CM | POA: Diagnosis not present

## 2024-09-24 DIAGNOSIS — R1312 Dysphagia, oropharyngeal phase: Secondary | ICD-10-CM

## 2024-09-24 DIAGNOSIS — N62 Hypertrophy of breast: Secondary | ICD-10-CM | POA: Diagnosis not present

## 2024-09-24 DIAGNOSIS — M25511 Pain in right shoulder: Secondary | ICD-10-CM | POA: Diagnosis not present

## 2024-09-24 DIAGNOSIS — M546 Pain in thoracic spine: Secondary | ICD-10-CM

## 2024-09-24 DIAGNOSIS — R1319 Other dysphagia: Secondary | ICD-10-CM

## 2024-09-24 DIAGNOSIS — R1310 Dysphagia, unspecified: Secondary | ICD-10-CM | POA: Insufficient documentation

## 2024-09-24 DIAGNOSIS — M25512 Pain in left shoulder: Secondary | ICD-10-CM | POA: Diagnosis not present

## 2024-09-24 DIAGNOSIS — G8929 Other chronic pain: Secondary | ICD-10-CM

## 2024-09-24 DIAGNOSIS — K2100 Gastro-esophageal reflux disease with esophagitis, without bleeding: Secondary | ICD-10-CM

## 2024-09-24 NOTE — Progress Notes (Unsigned)
 Pt presents for annual. Pt needs a pap, had mammogram on 01/15. Pt has questions about menopause. No other questions or concerns at this time.
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
## 2024-09-25 ENCOUNTER — Encounter: Payer: Self-pay | Admitting: Obstetrics

## 2024-09-25 NOTE — Telephone Encounter (Signed)
 r

## 2024-09-25 NOTE — Addendum Note (Signed)
 Addended by: GERSHON COUGH R on: 09/25/2024 12:36 PM   Modules accepted: Orders

## 2024-09-25 NOTE — Telephone Encounter (Signed)
 Spoke to son and patient on phone informed of PT order son stated three years ago had ins that would have covered MRI and wanted that to be addressed. Did not mention if wanted to proceed with physical therapy just hung up.

## 2024-09-25 NOTE — Addendum Note (Signed)
 Addended by: GERSHON COUGH R on: 09/25/2024 01:06 PM   Modules accepted: Orders

## 2024-09-25 NOTE — Telephone Encounter (Signed)
 I never did see this message that I recall but upon review of the chart saw it. I have put in the order for PT if they wish to proceed. Thank you.

## 2024-09-26 LAB — CYTOLOGY - PAP
Comment: NEGATIVE
Diagnosis: NEGATIVE
High risk HPV: NEGATIVE

## 2024-10-02 ENCOUNTER — Telehealth: Payer: Self-pay | Admitting: Podiatry

## 2024-10-02 ENCOUNTER — Telehealth: Payer: Self-pay

## 2024-10-02 NOTE — Telephone Encounter (Signed)
 Patient Secret & friend, Alm called in asking to speak with the practice leader, Anabel, regarding an ongoing issue. Advised: Anabel was unavailable at the moment but I can try to assist in resolving their issue. Patient inquired of active referrals from Dr. Gershon. There are 2. One for Physical Therapy and another for Radiology (MRI) Alm was concerned about the insurance covering the MRI and doesn't know where they should go to get it done without getting charged for it. He also requested medical records. Gave the [facility] medical records number for them to call as well as the address for the outpatient Physical Therapy that was ordered. Advised: I would forward a message to Dr. Gershon to ask for some clarity surrounding the details of getting the MRI done; as well as to Anabel Hayes because they said it was an ongoing issue that she had already assisted them with.

## 2024-10-02 NOTE — Telephone Encounter (Signed)
 Insurance is requiring 4 weeks worth of conservative treatment such as physical therapy, chiropractic osteopathic manipulative treatment or home exercise program within the past 6 months. Also latest visit notes with exam findings that show problem with the joint (positive orthopedic sign consistent with laxity/instability)

## 2024-10-02 NOTE — Telephone Encounter (Signed)
 Spoke to pt and her friend Alm  I will follow-up with Dr. Gershon.

## 2024-10-05 ENCOUNTER — Telehealth: Payer: Self-pay | Admitting: Gastroenterology

## 2024-10-05 DIAGNOSIS — R110 Nausea: Secondary | ICD-10-CM

## 2024-10-05 MED ORDER — ONDANSETRON HCL 8 MG PO TABS: 8.0000 mg | ORAL_TABLET | Freq: Three times a day (TID) | ORAL | 0 refills | Status: AC | PRN

## 2024-10-05 NOTE — Telephone Encounter (Signed)
 Followed up with patient and her partner, caregiver to see how she is doing post EGD with dilatation. She reports with the antiacid medications in addition to the amitriptyline  prescribed by Dr. Dottie she overall has been doing well. She has been adhering to strict GERD diet.   She does note some choking on large pills. Encouraged she coat with pudding or applesauce.   Patient has appt with [facility] to see surgeon for possible surgical options. She could not recall name of surgeon.  Requests refill on ondansetron  prn nausea, refilled

## 2024-10-12 ENCOUNTER — Telehealth: Payer: Self-pay | Admitting: Podiatry

## 2024-10-12 NOTE — Telephone Encounter (Signed)
 Spoke to pt, she has not scheduled MRI at this time nor has she been contacted by anyone to schedule.  She asked if we had heard back from Medicaid to see if they were going to pay.  I told her they were not.  She then stated she was going to wait on having MRI. I thanked her for her time and to contact me if she had any further questions.

## 2024-10-18 ENCOUNTER — Ambulatory Visit: Payer: MEDICAID | Admitting: Sports Medicine

## 2024-10-25 ENCOUNTER — Ambulatory Visit (HOSPITAL_BASED_OUTPATIENT_CLINIC_OR_DEPARTMENT_OTHER): Payer: MEDICAID | Admitting: Family Medicine
